# Patient Record
Sex: Male | Born: 1945 | Race: Black or African American | Hispanic: No | Marital: Married | Smoking: Current every day smoker
Health system: Southern US, Community
[De-identification: ages and names within clinical notes are randomized; demographics above are authoritative.]

## PROBLEM LIST (undated history)

## (undated) DIAGNOSIS — M549 Dorsalgia, unspecified: Secondary | ICD-10-CM

## (undated) DIAGNOSIS — E785 Hyperlipidemia, unspecified: Secondary | ICD-10-CM

## (undated) DIAGNOSIS — C9000 Multiple myeloma not having achieved remission: Secondary | ICD-10-CM

## (undated) DIAGNOSIS — I10 Essential (primary) hypertension: Secondary | ICD-10-CM

## (undated) DIAGNOSIS — R7302 Impaired glucose tolerance (oral): Secondary | ICD-10-CM

## (undated) DIAGNOSIS — M47812 Spondylosis without myelopathy or radiculopathy, cervical region: Secondary | ICD-10-CM

## (undated) DIAGNOSIS — C801 Malignant (primary) neoplasm, unspecified: Secondary | ICD-10-CM

## (undated) DIAGNOSIS — R361 Hematospermia: Secondary | ICD-10-CM

## (undated) DIAGNOSIS — F17200 Nicotine dependence, unspecified, uncomplicated: Secondary | ICD-10-CM

## (undated) DIAGNOSIS — F4310 Post-traumatic stress disorder, unspecified: Secondary | ICD-10-CM

## (undated) DIAGNOSIS — N400 Enlarged prostate without lower urinary tract symptoms: Secondary | ICD-10-CM

## (undated) DIAGNOSIS — N529 Male erectile dysfunction, unspecified: Secondary | ICD-10-CM

## (undated) DIAGNOSIS — E669 Obesity, unspecified: Secondary | ICD-10-CM

## (undated) HISTORY — PX: OTHER SURGICAL HISTORY: SHX169

## (undated) HISTORY — DX: Post-traumatic stress disorder, unspecified: F43.10

## (undated) HISTORY — PX: WISDOM TOOTH EXTRACTION: SHX21

## (undated) HISTORY — DX: Spondylosis without myelopathy or radiculopathy, cervical region: M47.812

## (undated) HISTORY — DX: Malignant (primary) neoplasm, unspecified: C80.1

## (undated) HISTORY — DX: Nicotine dependence, unspecified, uncomplicated: F17.200

## (undated) HISTORY — DX: Impaired glucose tolerance (oral): R73.02

## (undated) HISTORY — DX: Male erectile dysfunction, unspecified: N52.9

## (undated) HISTORY — DX: Obesity, unspecified: E66.9

## (undated) HISTORY — DX: Benign prostatic hyperplasia without lower urinary tract symptoms: N40.0

## (undated) HISTORY — DX: Hematospermia: R36.1

## (undated) HISTORY — DX: Essential (primary) hypertension: I10

## (undated) HISTORY — PX: CHOLECYSTECTOMY: SHX55

## (undated) HISTORY — DX: Multiple myeloma not having achieved remission: C90.00

## (undated) HISTORY — DX: Dorsalgia, unspecified: M54.9

## (undated) HISTORY — DX: Hyperlipidemia, unspecified: E78.5

## (undated) HISTORY — PX: APPENDECTOMY: SHX54

---

## 2000-06-06 ENCOUNTER — Encounter: Payer: Self-pay | Admitting: Family Medicine

## 2000-06-06 ENCOUNTER — Ambulatory Visit (HOSPITAL_COMMUNITY): Admission: RE | Admit: 2000-06-06 | Discharge: 2000-06-06 | Payer: Self-pay | Admitting: Family Medicine

## 2000-07-02 ENCOUNTER — Ambulatory Visit (HOSPITAL_COMMUNITY): Admission: RE | Admit: 2000-07-02 | Discharge: 2000-07-02 | Payer: Self-pay | Admitting: Family Medicine

## 2000-07-02 ENCOUNTER — Encounter: Payer: Self-pay | Admitting: Family Medicine

## 2000-07-07 ENCOUNTER — Ambulatory Visit (HOSPITAL_COMMUNITY): Admission: RE | Admit: 2000-07-07 | Discharge: 2000-07-07 | Payer: Self-pay | Admitting: Family Medicine

## 2000-07-07 ENCOUNTER — Encounter: Payer: Self-pay | Admitting: Family Medicine

## 2000-10-14 ENCOUNTER — Emergency Department (HOSPITAL_COMMUNITY): Admission: EM | Admit: 2000-10-14 | Discharge: 2000-10-14 | Payer: Self-pay | Admitting: Emergency Medicine

## 2000-10-14 ENCOUNTER — Encounter: Payer: Self-pay | Admitting: Emergency Medicine

## 2000-10-15 ENCOUNTER — Inpatient Hospital Stay (HOSPITAL_COMMUNITY): Admission: AD | Admit: 2000-10-15 | Discharge: 2000-10-21 | Payer: Self-pay | Admitting: Internal Medicine

## 2000-10-15 ENCOUNTER — Encounter: Payer: Self-pay | Admitting: Internal Medicine

## 2003-01-21 ENCOUNTER — Ambulatory Visit (HOSPITAL_COMMUNITY): Admission: RE | Admit: 2003-01-21 | Discharge: 2003-01-21 | Payer: Self-pay | Admitting: Family Medicine

## 2003-02-15 ENCOUNTER — Inpatient Hospital Stay (HOSPITAL_COMMUNITY): Admission: RE | Admit: 2003-02-15 | Discharge: 2003-02-19 | Payer: Self-pay | Admitting: Neurosurgery

## 2005-04-27 ENCOUNTER — Ambulatory Visit: Payer: Self-pay | Admitting: Family Medicine

## 2005-05-02 ENCOUNTER — Ambulatory Visit (HOSPITAL_COMMUNITY): Admission: RE | Admit: 2005-05-02 | Discharge: 2005-05-02 | Payer: Self-pay | Admitting: Family Medicine

## 2005-05-02 ENCOUNTER — Encounter: Payer: Self-pay | Admitting: Family Medicine

## 2005-06-04 ENCOUNTER — Ambulatory Visit (HOSPITAL_BASED_OUTPATIENT_CLINIC_OR_DEPARTMENT_OTHER): Admission: RE | Admit: 2005-06-04 | Discharge: 2005-06-04 | Payer: Self-pay | Admitting: Orthopedic Surgery

## 2006-01-14 ENCOUNTER — Ambulatory Visit: Payer: Self-pay | Admitting: Family Medicine

## 2006-01-15 ENCOUNTER — Encounter: Payer: Self-pay | Admitting: Family Medicine

## 2006-01-15 LAB — CONVERTED CEMR LAB
Cholesterol: 255 mg/dL — ABNORMAL HIGH (ref 0–200)
HDL: 45 mg/dL (ref >39)
Triglycerides: 259 mg/dL — ABNORMAL HIGH (ref <150)

## 2006-01-18 ENCOUNTER — Encounter: Payer: Self-pay | Admitting: Family Medicine

## 2006-01-18 LAB — CONVERTED CEMR LAB
ALT: 21 units/L (ref 0–53)
AST: 20 units/L (ref 0–37)
Albumin: 4.1 g/dL (ref 3.5–5.2)
Alkaline Phosphatase: 107 units/L (ref 39–117)
Total Bilirubin: 0.3 mg/dL (ref 0.3–1.2)

## 2006-07-02 ENCOUNTER — Ambulatory Visit: Payer: Self-pay | Admitting: Family Medicine

## 2006-07-02 ENCOUNTER — Ambulatory Visit (HOSPITAL_COMMUNITY): Admission: RE | Admit: 2006-07-02 | Discharge: 2006-07-02 | Payer: Self-pay | Admitting: Family Medicine

## 2006-07-08 ENCOUNTER — Ambulatory Visit (HOSPITAL_COMMUNITY): Admission: RE | Admit: 2006-07-08 | Discharge: 2006-07-08 | Payer: Self-pay | Admitting: Family Medicine

## 2006-08-05 ENCOUNTER — Ambulatory Visit: Payer: Self-pay | Admitting: Family Medicine

## 2006-08-06 ENCOUNTER — Encounter: Payer: Self-pay | Admitting: Family Medicine

## 2006-09-20 ENCOUNTER — Ambulatory Visit: Payer: Self-pay | Admitting: Family Medicine

## 2006-11-02 ENCOUNTER — Emergency Department (HOSPITAL_COMMUNITY): Admission: EM | Admit: 2006-11-02 | Discharge: 2006-11-02 | Payer: Self-pay | Admitting: Emergency Medicine

## 2006-11-04 ENCOUNTER — Emergency Department (HOSPITAL_COMMUNITY): Admission: EM | Admit: 2006-11-04 | Discharge: 2006-11-04 | Payer: Self-pay | Admitting: Emergency Medicine

## 2006-11-07 ENCOUNTER — Ambulatory Visit: Payer: Self-pay | Admitting: Family Medicine

## 2006-11-07 LAB — CONVERTED CEMR LAB
ALT: 17 units/L (ref 0–53)
AST: 17 units/L (ref 0–37)
Albumin: 4.3 g/dL (ref 3.5–5.2)
Alkaline Phosphatase: 90 units/L (ref 39–117)
Basophils Absolute: 0 10*3/uL (ref 0.0–0.1)
Basophils Relative: 0 % (ref 0–1)
Calcium: 9.7 mg/dL (ref 8.4–10.5)
Creatinine, Ser: 1.01 mg/dL (ref 0.40–1.50)
Eosinophils Relative: 2 % (ref 0–5)
HDL: 56 mg/dL (ref >39)
Hemoglobin: 12.1 g/dL — ABNORMAL LOW (ref 13.0–17.0)
MCHC: 32.6 g/dL (ref 30.0–36.0)
Monocytes Absolute: 0.5 10*3/uL (ref 0.2–0.7)
Neutro Abs: 2.9 10*3/uL (ref 1.7–7.7)
Platelets: 180 10*3/uL (ref 150–400)
RDW: 16 % — ABNORMAL HIGH (ref 11.5–14.0)
Total Protein: 7.5 g/dL (ref 6.0–8.3)
Triglycerides: 164 mg/dL — ABNORMAL HIGH (ref <150)

## 2006-11-11 ENCOUNTER — Emergency Department (HOSPITAL_COMMUNITY): Admission: EM | Admit: 2006-11-11 | Discharge: 2006-11-11 | Payer: Self-pay | Admitting: Emergency Medicine

## 2007-01-02 ENCOUNTER — Encounter: Payer: Self-pay | Admitting: Family Medicine

## 2007-05-12 ENCOUNTER — Ambulatory Visit: Payer: Self-pay | Admitting: Family Medicine

## 2007-05-15 ENCOUNTER — Encounter: Payer: Self-pay | Admitting: Family Medicine

## 2007-05-15 DIAGNOSIS — E669 Obesity, unspecified: Secondary | ICD-10-CM | POA: Insufficient documentation

## 2007-05-15 DIAGNOSIS — E785 Hyperlipidemia, unspecified: Secondary | ICD-10-CM

## 2007-05-15 DIAGNOSIS — I10 Essential (primary) hypertension: Secondary | ICD-10-CM

## 2007-08-13 ENCOUNTER — Ambulatory Visit: Payer: Self-pay | Admitting: Family Medicine

## 2007-08-13 DIAGNOSIS — D179 Benign lipomatous neoplasm, unspecified: Secondary | ICD-10-CM | POA: Insufficient documentation

## 2007-08-13 DIAGNOSIS — M479 Spondylosis, unspecified: Secondary | ICD-10-CM | POA: Insufficient documentation

## 2007-08-15 ENCOUNTER — Encounter: Payer: Self-pay | Admitting: Family Medicine

## 2007-08-18 ENCOUNTER — Ambulatory Visit (HOSPITAL_COMMUNITY): Admission: RE | Admit: 2007-08-18 | Discharge: 2007-08-18 | Payer: Self-pay | Admitting: Family Medicine

## 2007-09-10 ENCOUNTER — Encounter: Payer: Self-pay | Admitting: Family Medicine

## 2007-09-18 ENCOUNTER — Ambulatory Visit: Payer: Self-pay | Admitting: Family Medicine

## 2007-09-18 LAB — CONVERTED CEMR LAB
Protein, U semiquant: 30
Specific Gravity, Urine: 1.025
Urobilinogen, UA: 0.2
WBC Urine, dipstick: NEGATIVE

## 2007-09-23 DIAGNOSIS — N3000 Acute cystitis without hematuria: Secondary | ICD-10-CM | POA: Insufficient documentation

## 2007-09-23 DIAGNOSIS — N401 Enlarged prostate with lower urinary tract symptoms: Secondary | ICD-10-CM | POA: Insufficient documentation

## 2008-01-20 ENCOUNTER — Ambulatory Visit: Payer: Self-pay | Admitting: Family Medicine

## 2008-01-20 DIAGNOSIS — F4310 Post-traumatic stress disorder, unspecified: Secondary | ICD-10-CM

## 2008-01-20 DIAGNOSIS — F329 Major depressive disorder, single episode, unspecified: Secondary | ICD-10-CM

## 2008-01-21 LAB — CONVERTED CEMR LAB
AST: 20 units/L (ref 0–37)
Bilirubin, Direct: <0.1 mg/dL (ref 0.0–0.3)
CO2: 21 meq/L (ref 19–32)
Calcium: 9.6 mg/dL (ref 8.4–10.5)
Creatinine, Ser: 1 mg/dL (ref 0.40–1.50)
Glucose, Bld: 107 mg/dL — ABNORMAL HIGH (ref 70–99)
PSA: 0.82 ng/mL (ref 0.10–4.00)
Sodium: 140 meq/L (ref 135–145)
Total Bilirubin: 0.3 mg/dL (ref 0.3–1.2)
Total CHOL/HDL Ratio: 5.5

## 2008-01-26 ENCOUNTER — Telehealth: Payer: Self-pay | Admitting: Family Medicine

## 2008-01-28 ENCOUNTER — Telehealth: Payer: Self-pay | Admitting: Family Medicine

## 2008-02-04 ENCOUNTER — Encounter: Payer: Self-pay | Admitting: Family Medicine

## 2008-07-29 ENCOUNTER — Ambulatory Visit: Payer: Self-pay | Admitting: Family Medicine

## 2008-07-29 DIAGNOSIS — M549 Dorsalgia, unspecified: Secondary | ICD-10-CM

## 2008-08-02 ENCOUNTER — Encounter: Payer: Self-pay | Admitting: Family Medicine

## 2008-08-02 ENCOUNTER — Telehealth: Payer: Self-pay | Admitting: Family Medicine

## 2008-08-02 DIAGNOSIS — R361 Hematospermia: Secondary | ICD-10-CM

## 2008-08-04 ENCOUNTER — Encounter: Payer: Self-pay | Admitting: Family Medicine

## 2008-08-04 LAB — CONVERTED CEMR LAB
BUN: 13 mg/dL (ref 6–23)
Basophils Absolute: 0 10*3/uL (ref 0.0–0.1)
Bilirubin, Direct: 0.1 mg/dL (ref 0.0–0.3)
CO2: 24 meq/L (ref 19–32)
Chloride: 106 meq/L (ref 96–112)
Eosinophils Relative: 3 % (ref 0–5)
Glucose, Bld: 97 mg/dL (ref 70–99)
HCT: 36.6 % — ABNORMAL LOW (ref 39.0–52.0)
Hemoglobin: 11.4 g/dL — ABNORMAL LOW (ref 13.0–17.0)
Indirect Bilirubin: 0.3 mg/dL (ref 0.0–0.9)
LDL Cholesterol: 187 mg/dL — ABNORMAL HIGH (ref 0–99)
Lymphocytes Relative: 29 % (ref 12–46)
Lymphs Abs: 1.2 10*3/uL (ref 0.7–4.0)
Monocytes Absolute: 0.5 10*3/uL (ref 0.1–1.0)
Monocytes Relative: 11 % (ref 3–12)
Potassium: 4.3 meq/L (ref 3.5–5.3)
RDW: 15.6 % — ABNORMAL HIGH (ref 11.5–15.5)
TSH: 1.365 microintl units/mL (ref 0.350–4.500)
VLDL: 24 mg/dL (ref 0–40)

## 2008-12-03 ENCOUNTER — Encounter: Payer: Self-pay | Admitting: Family Medicine

## 2009-01-06 ENCOUNTER — Ambulatory Visit: Payer: Self-pay | Admitting: Family Medicine

## 2009-01-06 ENCOUNTER — Ambulatory Visit (HOSPITAL_COMMUNITY): Admission: RE | Admit: 2009-01-06 | Discharge: 2009-01-06 | Payer: Self-pay | Admitting: Family Medicine

## 2009-01-07 ENCOUNTER — Encounter: Payer: Self-pay | Admitting: Gastroenterology

## 2009-01-13 ENCOUNTER — Encounter: Payer: Self-pay | Admitting: Gastroenterology

## 2009-01-17 ENCOUNTER — Ambulatory Visit: Payer: Self-pay | Admitting: Gastroenterology

## 2009-01-17 ENCOUNTER — Ambulatory Visit (HOSPITAL_COMMUNITY): Admission: RE | Admit: 2009-01-17 | Discharge: 2009-01-17 | Payer: Self-pay | Admitting: Gastroenterology

## 2009-01-19 ENCOUNTER — Encounter (INDEPENDENT_AMBULATORY_CARE_PROVIDER_SITE_OTHER): Payer: Self-pay

## 2009-04-11 ENCOUNTER — Ambulatory Visit: Payer: Self-pay | Admitting: Family Medicine

## 2009-04-11 DIAGNOSIS — M129 Arthropathy, unspecified: Secondary | ICD-10-CM | POA: Insufficient documentation

## 2009-05-19 ENCOUNTER — Telehealth: Payer: Self-pay | Admitting: Family Medicine

## 2009-05-19 LAB — CONVERTED CEMR LAB
CO2: 24 meq/L (ref 19–32)
Calcium: 9.1 mg/dL (ref 8.4–10.5)
Chloride: 105 meq/L (ref 96–112)
Cholesterol: 227 mg/dL — ABNORMAL HIGH (ref 0–200)
Creatinine, Ser: 1.08 mg/dL (ref 0.40–1.50)
Glucose, Bld: 84 mg/dL (ref 70–99)
HCT: 40.7 % (ref 39.0–52.0)
Hgb A1c MFr Bld: 6 % — ABNORMAL HIGH (ref <5.7)
MCV: 83.1 fL (ref 78.0–100.0)
RBC: 4.9 M/uL (ref 4.22–5.81)
Sodium: 138 meq/L (ref 135–145)
TSH: 0.976 microintl units/mL (ref 0.350–4.500)
Total Bilirubin: 0.3 mg/dL (ref 0.3–1.2)
Total Protein: 7.4 g/dL (ref 6.0–8.3)
Triglycerides: 94 mg/dL (ref <150)
VLDL: 19 mg/dL (ref 0–40)
Vit D, 25-Hydroxy: 19 ng/mL — ABNORMAL LOW (ref 30–89)
WBC: 4.6 10*3/uL (ref 4.0–10.5)

## 2009-05-20 ENCOUNTER — Ambulatory Visit: Payer: Self-pay | Admitting: Family Medicine

## 2009-05-20 DIAGNOSIS — M109 Gout, unspecified: Secondary | ICD-10-CM

## 2009-05-20 DIAGNOSIS — E559 Vitamin D deficiency, unspecified: Secondary | ICD-10-CM

## 2009-06-02 ENCOUNTER — Ambulatory Visit: Payer: Self-pay | Admitting: Family Medicine

## 2009-06-02 LAB — CONVERTED CEMR LAB
Cholesterol, target level: 200 mg/dL
LDL Goal: 70 mg/dL

## 2009-06-07 LAB — CONVERTED CEMR LAB: Uric Acid, Serum: 10 mg/dL — ABNORMAL HIGH (ref 4.0–7.8)

## 2009-08-08 ENCOUNTER — Telehealth: Payer: Self-pay | Admitting: Family Medicine

## 2009-08-10 ENCOUNTER — Telehealth: Payer: Self-pay | Admitting: Physician Assistant

## 2009-08-11 ENCOUNTER — Telehealth: Payer: Self-pay | Admitting: Physician Assistant

## 2009-09-06 ENCOUNTER — Ambulatory Visit: Payer: Self-pay | Admitting: Family Medicine

## 2009-09-07 ENCOUNTER — Encounter: Payer: Self-pay | Admitting: Family Medicine

## 2009-09-07 LAB — CONVERTED CEMR LAB
BUN: 21 mg/dL (ref 6–23)
CO2: 26 meq/L (ref 19–32)
Calcium: 9.5 mg/dL (ref 8.4–10.5)
Chloride: 105 meq/L (ref 96–112)
Creatinine, Ser: 1.1 mg/dL (ref 0.40–1.50)
Glucose, Bld: 90 mg/dL (ref 70–99)
HDL: 33 mg/dL — ABNORMAL LOW (ref >39)
Hgb A1c MFr Bld: 6.2 % — ABNORMAL HIGH (ref <5.7)

## 2009-09-11 DIAGNOSIS — E739 Lactose intolerance, unspecified: Secondary | ICD-10-CM

## 2009-09-15 ENCOUNTER — Encounter: Payer: Self-pay | Admitting: Family Medicine

## 2009-09-15 LAB — CONVERTED CEMR LAB
Direct LDL: 102 mg/dL — ABNORMAL HIGH
Uric Acid, Serum: 6.9 mg/dL (ref 4.0–7.8)

## 2009-11-07 ENCOUNTER — Telehealth: Payer: Self-pay | Admitting: Family Medicine

## 2009-12-08 ENCOUNTER — Ambulatory Visit: Payer: Self-pay | Admitting: Family Medicine

## 2010-01-17 DIAGNOSIS — F528 Other sexual dysfunction not due to a substance or known physiological condition: Secondary | ICD-10-CM | POA: Insufficient documentation

## 2010-01-22 ENCOUNTER — Encounter: Payer: Self-pay | Admitting: Family Medicine

## 2010-01-31 NOTE — Letter (Signed)
Summary: Letter  Letter   Imported By: Lind Guest 09/16/2009 13:50:35  _____________________________________________________________________  External Attachment:    Type:   Image     Comment:   External Document

## 2010-01-31 NOTE — Progress Notes (Signed)
Summary: update lab order  Phone Note Call from Patient   Summary of Call: he went to do labs this morning and the lady over there  said to come by here and to get you to add on to his lab order  see if he has gout in his foot he is coming in tomorrow  and he is walking with  crutches Initial call taken by: Lind Guest,  May 19, 2009 11:37 AM  Follow-up for Phone Call        can we add uric acid to his labs? Follow-up by: Everitt Amber LPN,  May 19, 2009 1:22 PM  Additional Follow-up for Phone Call Additional follow up Details #1::        pls add uric acid level to labs Additional Follow-up by: Syliva Overman MD,  May 19, 2009 4:57 PM    Additional Follow-up for Phone Call Additional follow up Details #2::    already on lab order Follow-up by: Adella Hare LPN,  May 20, 2009 9:03 AM

## 2010-01-31 NOTE — Letter (Signed)
Summary: history and physical  history and physical   Imported By: Curtis Sites 06/21/2009 14:44:23  _____________________________________________________________________  External Attachment:    Type:   Image     Comment:   External Document

## 2010-01-31 NOTE — Progress Notes (Signed)
  Phone Note From Pharmacy   [facility]  [address] [phone]* Summary of Call: may we refill  hydrocodone? Initial call taken by: Adella Hare LPN,  August 08, 2009 2:09 PM  Follow-up for Phone Call        refill x 1 only, needs fasting lipid, chem 7 and HBA1C in 4 weeks needs to sched oV also, pls order and let him know Follow-up by: Syliva Overman MD,  August 08, 2009 3:33 PM  Additional Follow-up for Phone Call Additional follow up Details #1::        patient aware Additional Follow-up by: Adella Hare LPN,  August 08, 2009 3:39 PM    Prescriptions: VICODIN 5-500 MG TABS (HYDROCODONE-ACETAMINOPHEN) take 1 tablet every 4-6 hrs as needed for pain  #40 x 0   Entered by:   Adella Hare LPN   Authorized by:   Syliva Overman MD   Signed by:   Adella Hare LPN on 17/61/6073   Method used:   Printed then faxed to ...       [facility]  [address] [phone]* (retail)       [address]       [place], [place]  [address]       Ph: [phone] or [phone]       Fax: [phone]   RxID:   [id number]

## 2010-01-31 NOTE — Progress Notes (Signed)
  Phone Note Call from Patient   Summary of Call: Patient wants refill on phentermine. Last here 06/02/09 and his next OV is scheduled for 09/06/09 [facility] Initial call taken by: Everitt Amber LPN,  August 11, 2009 9:23 AM  Follow-up for Phone Call        this was done yesterday. Follow-up by: Esperanza Sheets PA,  August 11, 2009 9:36 AM

## 2010-01-31 NOTE — Letter (Signed)
Summary: demographic  demographic   Imported By: Curtis Sites 06/21/2009 14:43:44  _____________________________________________________________________  External Attachment:    Type:   Image     Comment:   External Document

## 2010-01-31 NOTE — Assessment & Plan Note (Signed)
Summary: office visit   Vital Signs:  Patient profile:   65 year old male Height:      72 inches Weight:      257.25 pounds BMI:     35.02 O2 Sat:      95 % Pulse rate:   81 / minute Pulse rhythm:   regular Resp:     16 per minute BP sitting:   140 / 80  (right arm)  Vitals Entered By: Everitt Amber LPN (September 06, 2009 8:41 AM)  Nutrition Counseling: Patient's BMI is greater than 25 and therefore counseled on weight management options. CC: Follow up chronic problems   Primary Care Provider:  Syliva Overman MD  CC:  Follow up chronic problems.  History of Present Illness: Reports  that he is doing much better, now that his apetite is reduced with the help[ of phentermine, and he is losing weight.He also reports less joint pains now that he has started allopurinol. Denies recent fever or chills. Denies sinus pressure, nasal congestion , ear pain or sore throat. Denies chest congestion, or cough productive of sputum. Denies chest pain, palpitations, PND, orthopnea or leg swelling. Denies abdominal pain, nausea, vomitting, diarrhea or constipation. Denies change in bowel movements or bloody stool. Denies dysuria , frequency, incontinence or hesitancy. Reports less  joint pain, swelling, or reduced mobility. Denies headaches, vertigo, seizures. Denies depression, anxiety or insomnia.Generally feels better now his wife ishome with him. Denies  rash, lesions, or itch.     Preventive Screening-Counseling & Management  Alcohol-Tobacco     Smoking Cessation Counseling: yes  Allergies: No Known Drug Allergies  Review of Systems      See HPI General:  Complains of fatigue. Eyes:  Denies discharge, eye pain, and red eye. Psych:  Complains of anxiety, depression, and mental problems; denies irritability, suicidal thoughts/plans, thoughts of violence, and unusual visions or sounds; unable to attend pTSD sessions as no funding avilable. Endo:  Denies excessive hunger. Heme:   Denies abnormal bruising and bleeding. Allergy:  Denies hives or rash and itching eyes.  Physical Exam  General:  Well-developed,obesein no acute distress; alert,appropriate and cooperative throughout examination HEENT: No facial asymmetry,  EOMI, No sinus tenderness, TM's Clear, oropharynx  pink and moist.   Chest: Clear to auscultation bilaterally.  CVS: S1, S2, No murmurs, No S3.   Abd: Soft, Nontender.  MS: Adequatdecreased  ROM spine,adequate in  hips, shoulders and knees.  Ext: No edema.   CNS: CN 2-12 intact, power tone and sensation normal throughout.   Skin: Intact, no visible lesions or rashes.  Psych: Good eye contact, normal affect.  Memory intact, not anxious or depressed appearing.    Impression & Recommendations:  Problem # 1:  OBESITY (ICD-278.00) Assessment Improved  Ht: 72 (09/06/2009)   Wt: 257.25 (09/06/2009)   BMI: 35.02 (09/06/2009), pt to continue phentermine as before  Problem # 2:  DEPRESSION, CHRONIC (ICD-311) Assessment: Improved  Discussed treatment options, including trial of antidpressant medication. Will refer to behavioral health. Follow-up call in in 24-48 hours and recheck in 2 weeks, sooner as needed. Patient agrees to call if any worsening of symptoms or thoughts of doing harm arise. Verified that the patient has no suicidal ideation at this time.   Problem # 3:  HYPERTENSION (ICD-401.9) Assessment: Deteriorated  BP today: 140/80, lifestyle modification only at this time Prior BP: 130/70 (06/02/2009)  Prior 10 Yr Risk Heart Disease: 22 % (06/02/2009)  Labs Reviewed: K+: 4.7 (05/19/2009) Creat: :  1.08 (05/19/2009)   Chol: 227 (05/19/2009)   HDL: 52 (05/19/2009)   LDL: 156 (05/19/2009)   TG: 94 (05/19/2009)  Problem # 4:  HYPERLIPIDEMIA (ICD-272.4) Assessment: Deteriorated  The following medications were removed from the medication list:    Simvastatin 40 Mg Tabs (Simvastatin) .Marland Kitchen... Take 1 tablet by mouth once a day His updated  medication list for this problem includes:    Lovastatin 40 Mg Tabs (Lovastatin) .Marland Kitchen... Take 1 tab by mouth at bedtime  Labs Reviewed: SGOT: 21 (05/19/2009)   SGPT: 19 (05/19/2009)  Lipid Goals: Chol Goal: 200 (06/02/2009)   HDL Goal: 40 (06/02/2009)   LDL Goal: 70 (06/02/2009)   TG Goal: 150 (06/02/2009)  Prior 10 Yr Risk Heart Disease: 22 % (06/02/2009)   HDL:52 (05/19/2009), 46 (08/02/2008)  LDL:156 (05/19/2009), 187 (08/02/2008)  Chol:227 (05/19/2009), 257 (08/02/2008)  Trig:94 (05/19/2009), 122 (08/02/2008)  Problem # 5:  IMPAIRED GLUCOSE TOLERANCE (ICD-271.3) Assessment: Comment Only hBA1C  6.2 09/2009 Pt advised to reduce carbohydrate intake, espescially sweets, and to start regular physical activity, at least 30 minutes 5 days weekly, to enable weight loss, and reduce the risk of becoming diabetic   Problem # 6:  GOUT (ICD-274.9) Assessment: Improved  His updated medication list for this problem includes:    Allopurinol 100 Mg Tabs (Allopurinol) ..... One tab by mouth once daily  Orders: T-Uric Acid (Blood) ([id number])  Complete Medication List: 1)  Ibuprofen 800 Mg Tabs (Ibuprofen) .... One tab by mouth bid 2)  Vitamin D (ergocalciferol) 50000 Unit Caps (Ergocalciferol) .... Take 1 weekly 3)  Vicodin 5-500 Mg Tabs (Hydrocodone-acetaminophen) .... Take 1 tablet every 4-6 hrs as needed for pain 4)  Aspirin 81 Mg Tbec (Aspirin) .... Take 1 daily 5)  Allopurinol 100 Mg Tabs (Allopurinol) .... One tab by mouth once daily 6)  Phentermine Hcl 37.5 Mg Tabs (Phentermine hcl) .... One tab by mouth once daily 7)  Lovastatin 40 Mg Tabs (Lovastatin) .... Take 1 tab by mouth at bedtime  Patient Instructions: 1)  Please schedule a follow-up appointment in 3 months. 2)  It is important that you exercise regularly at least 20 minutes 5 times a week. If you develop chest pain, have severe difficulty breathing, or feel very tired , stop exercising immediately and seek medical  attention. 3)  You need to lose weight. Consider a lower calorie diet and regular exercise.  4)  Uric acid level in 3 months 5)  Congrats on weight loss. 6)  Tobacco is very bad for your health and your loved ones! You Should stop smoking!. 7)  Stop Smoking Tips: Choose a Quit date. Cut down before the Quit date. decide what you will do as a substitute when you feel the urge to smoke(gum,toothpick,exercise).  Prescriptions: ALLOPURINOL 100 MG TABS (ALLOPURINOL) one tab by mouth once daily  #30 Tablet x 3   Entered by:   Everitt Amber LPN   Authorized by:   Syliva Overman MD   Signed by:   Everitt Amber LPN on 29/52/8413   Method used:   Printed then [facility]ed [facility] ...       [facility]  [address] [phone]* (retail)       [address]       [place], [place]  [address]       Ph: [phone] or [phone]       Fax: [phone]   RxID:   [id number] VICODIN 5-500 MG TABS (HYDROCODONE-ACETAMINOPHEN) take 1 tablet  every 4-6 hrs as needed for pain  #40 x 1   Entered by:   Everitt Amber LPN   Authorized by:   Syliva Overman MD   Signed by:   Everitt Amber LPN on 16/10/9602   Method used:   Printed then faxed to ...       [facility]  [address] [phone]* (retail)       [address]       [place], [place]  [address]       Ph: [phone] or [phone]       Fax: [phone]   RxID:   [id number] IBUPROFEN 800 MG TABS (IBUPROFEN) one tab by mouth bid  #100 x 3   Entered by:   Everitt Amber LPN   Authorized by:   Syliva Overman MD   Signed by:   Everitt Amber LPN on 74/25/9563   Method used:   Printed then faxed to ...       [facility]  [address] [phone]* (retail)       [address]       [place], [place]  [address]       Ph: [phone] or [phone]       Fax: [phone]   RxID:   [id number] LOVASTATIN 40 MG TABS (LOVASTATIN) Take 1 tab by mouth at bedtime  #30 x 3   Entered and Authorized by:   Syliva Overman MD   Signed by:   Syliva Overman MD on  09/06/2009   Method used:   Printed then faxed to ...       [facility]  [address] [phone]* (retail)       [address]       [place], [place]  [address]       Ph: [phone] or [phone]       Fax: [phone]   RxID:   [id number]     Preventive Care Screening  Last Tetanus Booster:    Date:  01/02/2004    Results:  Td

## 2010-01-31 NOTE — Letter (Signed)
Summary: TRIAGE  TRIAGE   Imported By: Diana Eves 01/07/2009 13:45:25  _____________________________________________________________________  External Attachment:    Type:   Image     Comment:   External Document  Appended Document: TRIAGE TRILYTE PREP.  Appended Document: TRIAGE [facility] pt. [facility]

## 2010-01-31 NOTE — Letter (Signed)
Summary: misc.  misc.   Imported By: Curtis Sites 06/22/2009 11:41:52  _____________________________________________________________________  External Attachment:    Type:   Image     Comment:   External Document

## 2010-01-31 NOTE — Letter (Signed)
Summary: Patient Notice, Colon Biopsy Results  [facility]  [address]   [place], [place] [address]   Phone: [phone]  Fax: [phone]       January 19, 2009   [name] [address] [place], [place]  [address] Mar 19, 1945    Dear Mr. [name],  I am pleased to inform you that the biopsies taken during your recent colonoscopy did not show any evidence of cancer upon pathologic examination.  Additional information/recommendations:  __Please follow a high fiber diet  __You should have a repeat colonoscopy examination  in 10 years.  Please call us if you are having persistent problems or have questions about your condition that have not been fully answered at this time.  Sincerely,    Hendricks Limes LPN  [facility] Ph: [phone]    Fax: [phone]

## 2010-01-31 NOTE — Progress Notes (Signed)
  Phone Note From Pharmacy   [facility]  [address]. [phone]* Summary of Call: requesting refill on phentermine Initial call taken by: Adella Hare LPN,  November 07, 2009 10:33 AM  Follow-up for Phone Call        refill x1 needs ov before anymore Follow-up by: Syliva Overman MD,  November 07, 2009 1:05 PM    Prescriptions: PHENTERMINE HCL 37.5 MG TABS (PHENTERMINE HCL) one tab by mouth once daily  #30 x 0   Entered by:   Adella Hare LPN   Authorized by:   Syliva Overman MD   Signed by:   Adella Hare LPN on 96/04/5407   Method used:   Printed then faxed to ...       [facility]  [address] [phone]* (retail)       [address]       [place], [place]  [address]       Ph: [phone] or [phone]       Fax: [phone]   RxID:   [id number]

## 2010-01-31 NOTE — Medication Information (Signed)
Summary: Tax adviser   Imported By: Lind Guest 01/06/2009 13:15:58  _____________________________________________________________________  External Attachment:    Type:   Image     Comment:   External Document

## 2010-01-31 NOTE — Letter (Signed)
Summary: progress notes  progress notes   Imported By: Curtis Sites 06/22/2009 11:42:27  _____________________________________________________________________  External Attachment:    Type:   Image     Comment:   External Document

## 2010-01-31 NOTE — Letter (Signed)
Summary: phone notes  phone notes   Imported By: Curtis Sites 06/22/2009 11:42:09  _____________________________________________________________________  External Attachment:    Type:   Image     Comment:   External Document

## 2010-01-31 NOTE — Letter (Signed)
Summary: consult  consult   Imported By: Curtis Sites 06/21/2009 14:43:26  _____________________________________________________________________  External Attachment:    Type:   Image     Comment:   External Document

## 2010-01-31 NOTE — Letter (Signed)
Summary: xray  xray   Imported By: Curtis Sites 06/22/2009 11:43:06  _____________________________________________________________________  External Attachment:    Type:   Image     Comment:   External Document

## 2010-01-31 NOTE — Letter (Signed)
Summary: labs  labs   Imported By: Curtis Sites 06/22/2009 11:41:36  _____________________________________________________________________  External Attachment:    Type:   Image     Comment:   External Document

## 2010-01-31 NOTE — Assessment & Plan Note (Signed)
Summary: right foot pain- room 2   Vital Signs:  Patient profile:   65 year old male Height:      72 inches Weight:      267.75 pounds BMI:     36.44 O2 Sat:      100 % on Room air Pulse rate:   80 / minute Resp:     16 per minute BP sitting:   160 / 70  (left arm)  Vitals Entered By: Adella Hare LPN (May 20, 2009 9:03 AM)  Serial Vital Signs/Assessments:  Time      Position  BP       Pulse  Resp  Temp     By                     144/92                         Esperanza Sheets PA  CC: right foot pain Is Patient Diabetic? No Pain Assessment Patient in pain? yes     Location: right foot Intensity: 5 Type: aching Onset of pain  Constant Comments did not bring meds to ov   Primary Provider:  Syliva Overman MD  CC:  right foot pain.  History of Present Illness: Pt developed Rt ankle/foot pain and swelling last Fri evening/Sat morning.  No trauma.  Hx of gout.  Pain and swelling have been worsening.  He is taking Ibuprofen , but needs new prescription. Had 1 beer but was the day after onset.  No diet changes.  Pain is worse with wt bearing.  Labs were done yesterday.  Has not started his cholesterol medication yet. He has been monitoring his BP at home & states it has been good.  He thinks it is up today because of his pain. He has started smoking again.  Plans to quit again as soon as he loses more weight.  He had been taking Phentermine, but has discontinued.  He has a few more at home still.    Allergies (verified): No Known Drug Allergies  Past History:  Past medical history reviewed for relevance to current acute and chronic problems.  Past Medical History: Current Problems:  NICOTINE ADDICTION (ICD-305.1) OBESITY (ICD-278.00) HYPERLIPIDEMIA (ICD-272.4) HYPERTENSION (ICD-401.9) BPH Gout  Physical Exam  General:  Well-developed,well-nourished,in no acute distress; alert,appropriate and cooperative throughout examination Head:  Normocephalic and  atraumatic without obvious abnormalities. No apparent alopecia or balding. Lungs:  Normal respiratory effort, chest expands symmetrically. Lungs are clear to auscultation, no crackles or wheezes. Heart:  Normal rate and regular rhythm. S1 and S2 normal without gallop, murmur, click, rub or other extra sounds. Msk:  Rt ankle: decreased ROM due to pain and swelling. Nontender to palp foot.  Mildly tender anterior ankle joint line. Nontender at Lateral malleolus, and inferior to lateral malleolus.  No ligament instability.   Pulses:  R posterior tibial normal and R dorsalis pedis normal.   Neurologic:  alert & oriented X3 and sensation intact to light touch.  Pt is ambulating with crutches. Skin:  Mild erythema noted medial Rt ankle.  No warmth to touch. Psych:  Cognition and judgment appear intact. Alert and cooperative with normal attention span and concentration. No apparent delusions, illusions, hallucinations   Impression & Recommendations:  Problem # 1:  GOUT (ICD-274.9) Assessment Deteriorated  Gout hand out given.  His updated medication list for this problem includes:    Ibuprofen 800 Mg Tabs (Ibuprofen) .Marland KitchenMarland KitchenMarland KitchenMarland Kitchen  One tab by mouth bid    Colcrys 0.6 Mg Tabs (Colchicine) .Marland Kitchen... Take 2 tabs now, then 1 tab 1 hr later.  then take 1 tab two times a day thereafter  Orders: Depo- Medrol 80mg  (J1040) Admin of Therapeutic Inj  intramuscular or subcutaneous ([address])  Problem # 2:  HYPERLIPIDEMIA (ICD-272.4) Assessment: Deteriorated Encouraged pt to restart Simvastatin as discussed. Chol diet handout given. His updated medication list for this problem includes:    Simvastatin 40 Mg Tabs (Simvastatin) .Marland Kitchen... Take 1 tablet by mouth once a day  Problem # 3:  HYPERTENSION (ICD-401.9) Assessment: Comment Only Little high today.  Will monitor. Not on any BP meds currently.  Problem # 4:  VITAMIN D DEFICIENCY (ICD-268.9) Assessment: New  Complete Medication List: 1)  Ibuprofen 800 Mg Tabs  (Ibuprofen) .... One tab by mouth bid 2)  Flomax 0.4 Mg Xr24h-cap (Tamsulosin hcl) .... One tab by mouth qd 3)  Simvastatin 40 Mg Tabs (Simvastatin) .... Take 1 tablet by mouth once a day 4)  Phentermine Hcl 37.5 Mg Caps (Phentermine hcl) .... Take one daily in am 5)  Colcrys 0.6 Mg Tabs (Colchicine) .... Take 2 tabs now, then 1 tab 1 hr later.  then take 1 tab two times a day thereafter 6)  Vitamin D (ergocalciferol) 50000 Unit Caps (Ergocalciferol) .... Take 1 weekly 7)  Vicodin 5-500 Mg Tabs (Hydrocodone-acetaminophen) .... Take 1 tablet every 4-6 hrs as needed for pain  Patient Instructions: 1)  Recheck appt next Thursday.  Sooner if worsens. 2)  I have prescribed Ibuprofen, Cholchine and Vicoden for your gout. 3)  I have prescribed Vit D as discussed. 4)  Restart your cholesterol medication.  And follow a low fat diet. 5)  Tobacco is very bad for your health and your loved ones! You Should stop smoking!. 6)  Stop Smoking Tips: Choose a Quit date. Cut down before the Quit date. decide what you will do as a substitute when you feel the urge to smoke(gum,toothpick,exercise). 7)  It is important that you exercise regularly at least 20 minutes 5 times a week. If you develop chest pain, have severe difficulty breathing, or feel very tired , stop exercising immediately and seek medical attention. 8)  You need to lose weight. Consider a lower calorie diet and regular exercise.  Prescriptions: VICODIN 5-500 MG TABS (HYDROCODONE-ACETAMINOPHEN) take 1 tablet every 4-6 hrs as needed for pain  #40 x 0   Entered and Authorized by:   Esperanza Sheets PA   Signed by:   Esperanza Sheets PA on 05/20/2009   Method used:   Printed then faxed to ...       [facility]  [address] [phone]* (retail)       [address]       [place], [place]  [address]       Ph: [phone] or [phone]       Fax: [phone]   RxID:   [id number] VITAMIN D (ERGOCALCIFEROL) 50000 UNIT CAPS (ERGOCALCIFEROL) take 1 weekly   #4 x 3   Entered and Authorized by:   Esperanza Sheets PA   Signed by:   Esperanza Sheets PA on 05/20/2009   Method used:   Electronically to        [facility]. [phone]* (retail)       [address]       [place], [place]  [address]  Ph: [phone] or [phone]       Fax: [phone]   RxID:   [id number] COLCRYS 0.6 MG TABS (COLCHICINE) take 2 tabs now, then 1 tab 1 hr later.  Then take 1 tab two times a day thereafter  #60 x 1   Entered and Authorized by:   Esperanza Sheets PA   Signed by:   Esperanza Sheets PA on 05/20/2009   Method used:   Electronically to        [facility]. [phone]* (retail)       [address]       [place], [place]  [address]       Ph: [phone] or [phone]       Fax: [phone]   RxID:   [id number] IBUPROFEN 800 MG TABS (IBUPROFEN) one tab by mouth bid  #100 x 1   Entered and Authorized by:   Esperanza Sheets PA   Signed by:   Esperanza Sheets PA on 05/20/2009   Method used:   Electronically to        [facility]. [phone]* (retail)       [address]       [place], [place]  [address]       Ph: [phone] or [phone]       Fax: [phone]   RxID:   [id number]    Medication Administration  Injection # 1:    Medication: Depo- Medrol 80mg     Diagnosis: GOUT (ICD-274.9)    Route: IM    Site: LUOQ gluteus    Exp Date: 1/12    Lot #: Johny [id number]    Mfr: Pharmacia    Patient tolerated injection without complications    Given by: Adella Hare LPN (May 20, 2009 9:56 AM)  Orders Added: 1)  Depo- Medrol 80mg  [J1040] 2)  Admin of Therapeutic Inj  intramuscular or subcutaneous [[address]] 3)  Est. Patient Level IV [[address]]

## 2010-01-31 NOTE — Progress Notes (Signed)
  Phone Note From Pharmacy   [facility]  [address]. [phone]* Summary of Call: okay to refill phentermine? Initial call taken by: Adella Hare LPN,  August 10, 2009 11:25 AM  Follow-up for Phone Call        May give 1 mos only, #30 no refill. Follow-up by: Esperanza Sheets PA,  August 10, 2009 1:01 PM    New/Updated Medications: PHENTERMINE HCL 37.5 MG TABS (PHENTERMINE HCL) one tab by mouth once daily Prescriptions: PHENTERMINE HCL 37.5 MG TABS (PHENTERMINE HCL) one tab by mouth once daily  #30 x 0   Entered by:   Adella Hare LPN   Authorized by:   Esperanza Sheets PA   Signed by:   Adella Hare LPN on 96/04/5407   Method used:   Printed then faxed to ...       [facility]  [address] [phone]* (retail)       [address]       [place], [place]  [address]       Ph: [phone] or [phone]       Fax: [phone]   RxID:   [id number]

## 2010-01-31 NOTE — Assessment & Plan Note (Signed)
Summary: physical- room 1   Vital Signs:  Patient profile:   65 year old male Height:      72 inches Weight:      262.25 pounds BMI:     35.70 O2 Sat:      98 % on Room air Pulse rate:   82 / minute Resp:     16 per minute BP sitting:   130 / 70  (left arm)  Vitals Entered By: Adella Hare LPN (June 02, 452 9:24 AM)  Nutrition Counseling: Patient's BMI is greater than 25 and therefore counseled on weight management options. CC: physical, Hypertension Management, Lipid Management Is Patient Diabetic? No Pain Assessment Patient in pain? no      Comments did not bring meds to ov   Primary Provider:  Syliva Overman MD  CC:  physical, Hypertension Management, and Lipid Management.  History of Present Illness: Pt is here today for a physical.  He was recently seen for a gout flare up.  He did not fill the colchicine prescription due to  cost.  He states he started a medication that the [place] gave him, but he did not bring it with him today.  His ankle and foot pain is much better and swelling has gone down.  He still has discomfort in the ankle though with prolonged standing.  Hx of htn.  Has been off meds x 3-4 meds.  Is active currently gardening.  Is not exercising. Hx of hyperlipidemia.  Had not been taking Simvastatin before last labs.  Since has restarted but admits he is not taking them regularly. He is not eating fried foods, but is not following a low fat diet. Pt has been trying to lose wt.  He is down 5# since his last visit.  Is only eating 1 meal a day.  Is eating ice cream at night though and drinking sodas.  + smoker currenty < 1 ppd. Last eye exam > 2 yrs ago. Colonoscopy UTD. Last Tetnus vaccine < 10 yrs ago ( approx 2 yrs ago)       Hypertension History:      He denies headache, chest pain, palpitations, dyspnea with exertion, peripheral edema, and side effects from treatment.  He notes no problems with any antihypertensive medication side effects.      Positive major cardiovascular risk factors include male age 31 years old or older, hyperlipidemia, hypertension, and current tobacco user.  Negative major cardiovascular risk factors include no history of diabetes.        Positive history for target organ damage include prior stroke (or TIA).  Further assessment for target organ damage reveals no history of ASHD, cardiac end-organ damage (CHF/LVH), peripheral vascular disease, renal insufficiency, or hypertensive retinopathy.    Lipid Management History:      Positive NCEP/ATP III risk factors include male age 25 years old or older, current tobacco user, hypertension, and prior stroke (or TIA).  Negative NCEP/ATP III risk factors include non-diabetic, no ASHD (atherosclerotic heart disease), and no peripheral vascular disease.        The patient states that he knows about the "Therapeutic Lifestyle Change" diet.  His compliance with the TLC diet is fair.  The patient expresses understanding of adjunctive measures for cholesterol lowering.  He expresses no side effects from his lipid-lowering medication.  The patient denies any symptoms to suggest myopathy or liver disease.      Allergies (verified): No Known Drug Allergies  Past History:  Past medical, surgical,  family and social histories (including risk factors) reviewed, and no changes noted (except as noted below).  Past Medical History: Reviewed history from 05/20/2009 and no changes required. Current Problems:  NICOTINE ADDICTION (ICD-305.1) OBESITY (ICD-278.00) HYPERLIPIDEMIA (ICD-272.4) HYPERTENSION (ICD-401.9) BPH Gout  Past Surgical History: Appendectomy Cholecystectomy Right bunionectomy Extraction of wisdom teeth Lumbar fusion 2004  Family History: Reviewed history from 01/06/2009 and no changes required. Mom HTN,Heart dz Dad deceased colon cancer age 78 Sister 2 ? healthy Brother 1 DM,Hep c,  Social History: Reviewed history from 09/18/2007 and no changes  required. Disabled Married Two children Former Smoker Alcohol use-no Drug use-no, past history  Review of Systems General:  Denies chills, fever, and loss of appetite. Eyes:  Denies blurring and double vision. ENT:  Complains of earache; denies nasal congestion and sore throat; INTERMITTENT ACHING LT EAR. CV:  Denies chest pain or discomfort, palpitations, and swelling of feet. Resp:  Denies cough and shortness of breath. GI:  Denies abdominal pain, bloody stools, change in bowel habits, constipation, dark tarry stools, diarrhea, indigestion, loss of appetite, nausea, and vomiting. GU:  Complains of decreased libido, erectile dysfunction, and nocturia; denies urinary frequency; INTERMITTENT NOCTURIA, NOTICES IS RELATED TO SODA INTAKE. Derm:  Denies lesion(s) and rash. Neuro:  Complains of numbness and tingling; denies headaches and weakness; SOMETIMES HAS IN RUE WHEN SLEEPING, HAS BEEN SINCE ROTATOR CUFF SURGERY. Psych:  Denies anxiety and depression. Allergy:  Denies seasonal allergies.  Physical Exam  General:  Well-developed,well-nourished,in no acute distress; alert,appropriate and cooperative throughout examination Head:  Normocephalic and atraumatic without obvious abnormalities. No apparent alopecia or balding. Eyes:  No corneal or conjunctival inflammation noted. EOMI. Perrla. Funduscopic exam benign, without hemorrhages, exudates or papilledema.  Ears:  External ear exam shows no significant lesions or deformities.  Otoscopic examination reveals clear canals, tympanic membranes are intact bilaterally without bulging, retraction, inflammation or discharge. Hearing is grossly normal bilaterally. Nose:  External nasal examination shows no deformity or inflammation. Nasal mucosa are pink and moist without lesions or exudates. Mouth:  Oral mucosa and oropharynx without lesions or exudates.  Teeth in good repair. Neck:  No deformities, masses, or tenderness noted. Lungs:  Normal  respiratory effort, chest expands symmetrically. Lungs are clear to auscultation, no crackles or wheezes. Heart:  Normal rate and regular rhythm. S1 and S2 normal without gallop, murmur, click, rub or other extra sounds. Abdomen:  Bowel sounds positive,abdomen soft and non-tender without masses, organomegaly or hernias noted. Rectal:  No external abnormalities noted. Normal sphincter tone. No rectal masses or tenderness. Genitalia:  Testes bilaterally descended without nodularity, tenderness or masses. No scrotal masses or lesions. No penis lesions or urethral discharge. circumcised.   Prostate:  no nodules, no asymmetry, and 1+ enlarged.   Msk:  Mild edema noted Rt ankle. normal ROM, no joint tenderness, no joint warmth, and no redness over joints.   Pulses:  R femoral normal, R posterior tibial normal, R dorsalis pedis normal, L femoral normal, L posterior tibial normal, and L dorsalis pedis normal.   Extremities:  No clubbing, cyanosis,  or deformity noted with normal full range of motion of all joints.   Neurologic:  alert & oriented X3, strength normal in all extremities, sensation intact to light touch, gait normal, and DTRs symmetrical and normal.   Skin:  Lg lipoma noted Lt superior posterior neck, and approx 1 cm Rt upper arm.  Cervical Nodes:  No lymphadenopathy noted Psych:  Cognition and judgment appear intact. Alert and cooperative with  normal attention span and concentration. No apparent delusions, illusions, hallucinations   Impression & Recommendations:  Problem # 1:  HYPERTENSION (ICD-401.9) Assessment Improved  Orders: T-Comprehensive Metabolic Panel ([id number])  BP today: 130/70 Prior BP: 160/70 (05/20/2009)  10 Yr Risk Heart Disease: 22 %  Labs Reviewed: K+: 4.7 (05/19/2009) Creat: : 1.08 (05/19/2009)   Chol: 227 (05/19/2009)   HDL: 52 (05/19/2009)   LDL: 156 (05/19/2009)   TG: 94 (05/19/2009)  Problem # 2:  HYPERLIPIDEMIA (ICD-272.4) Assessment: Comment  Only Encouraged medication compliance.  Discussed dietary changes.  His updated medication list for this problem includes:    Simvastatin 40 Mg Tabs (Simvastatin) .Marland Kitchen... Take 1 tablet by mouth once a day  Orders: T-Lipid Profile [id number]) T-Comprehensive Metabolic Panel [id number])  Labs Reviewed: SGOT: 21 (05/19/2009)   SGPT: 19 (05/19/2009)  Lipid Goals: Chol Goal: 200 (06/02/2009)   HDL Goal: 40 (06/02/2009)   LDL Goal: 70 (06/02/2009)   TG Goal: 150 (06/02/2009)  10 Yr Risk Heart Disease: 22 %   HDL:52 (05/19/2009), 46 (08/02/2008)  LDL:156 (05/19/2009), 187 (08/02/2008)  Chol:227 (05/19/2009), 257 (08/02/2008)  Trig:94 (05/19/2009), 122 (08/02/2008)  Problem # 3:  GOUT (ICD-274.9) Assessment: Improved  The following medications were removed from the medication list:    Colcrys 0.6 Mg Tabs (Colchicine) .Marland Kitchen... Take 2 tabs now, then 1 tab 1 hr later.  then take 1 tab two times a day thereafter His updated medication list for this problem includes:    Ibuprofen 800 Mg Tabs (Ibuprofen) ..... One tab by mouth bid  Orders: T-Uric Acid (Blood) [id number]) - now T-Uric Acid (Blood) [id number]) - 3 mos Miscellaneous Other Radiology (Misc Other Rad)  Problem # 4:  OBESITY (ICD-278.00) Assessment: Improved  Ht: 72 (06/02/2009)   Wt: 262.25 (06/02/2009)   BMI: 35.70 (06/02/2009)  Problem # 5:  VITAMIN D DEFICIENCY (ICD-268.9) Assessment: New  Orders: T-Vitamin D (25-Hydroxy) ([id number])  Problem # 6:  NICOTINE ADDICTION (ICD-305.1) Assessment: Comment Only  Encouraged smoking cessation.  Complete Medication List: 1)  Ibuprofen 800 Mg Tabs (Ibuprofen) .... One tab by mouth bid 2)  Simvastatin 40 Mg Tabs (Simvastatin) .... Take 1 tablet by mouth once a day 3)  Vitamin D (ergocalciferol) 50000 Unit Caps (Ergocalciferol) .... Take 1 weekly 4)  Vicodin 5-500 Mg Tabs (Hydrocodone-acetaminophen) .... Take 1 tablet every 4-6 hrs as needed for pain 5)  Aspirin 81 Mg Tbec  (Aspirin) .... Take 1 daily  Other Orders: Hemoccult Guaiac-1 spec.(in office) ([id number])  Hypertension Assessment/Plan:      The patient's hypertensive risk group is category C: Target organ damage and/or diabetes.  His calculated 10 year risk of coronary heart disease is 22 %.  Today's blood pressure is 130/70.    Lipid Assessment/Plan:      Based on NCEP/ATP III, the patient's risk factor category is "history of coronary disease, peripheral vascular disease, cerebrovascular disease, or aortic aneurysm along with either diabetes, current smoker, or LDL > 130 plus HDL < 40 plus triglycerides > 200".  The patient's lipid goals are as follows: Total cholesterol goal is 200; LDL cholesterol goal is 70; HDL cholesterol goal is 40; Triglyceride goal is 150.    Patient Instructions: 1)  Please schedule a follow-up appointment in 3 months. 2)  Tobacco is very bad for your health and your loved ones! You Should stop smoking!. 3)  Stop Smoking Tips: Choose a Quit date. Cut down before the Quit date. decide what you will do as a substitute when you feel the  urge to smoke(gum,toothpick,exercise). 4)  It is important that you exercise regularly at least 20 minutes 5 times a week. If you develop chest pain, have severe difficulty breathing, or feel very tired , stop exercising immediately and seek medical attention. 5)  You need to lose weight. Consider a lower calorie diet and regular exercise.  6)  Call back with the name of the gout medicine you are taking. 7)  I have ordered blood work to recheck you uric acid level today.  I have also ordered blood work to have done in 3 mos fasting. 8)  I have ordered an xray of your Rt ankle. 9)  YOU MUST TAKE YOUR CHOLESTEROL MEDICATION EVERY DAY. If you do not improve your cholesterol levels, you are putting your self at risk for a heart attack or stroke.  Your smoking also increases this risk!    Laboratory Results    Stool - Occult Blood Hemmoccult #1:  negative Date: 06/02/2009

## 2010-01-31 NOTE — Assessment & Plan Note (Signed)
Summary: office visit   Vital Signs:  Patient profile:   65 year old male Height:      72 inches Weight:      262.75 pounds BMI:     35.76 O2 Sat:      96 % Pulse rate:   86 / minute Pulse rhythm:   regular Resp:     16 per minute BP sitting:   140 / 78 Cuff size:   large  Vitals Entered By: Everitt Amber (January 06, 2009 8:30 AM)  Nutrition Counseling: Patient's BMI is greater than 25 and therefore counseled on weight management options. CC: Follow up chronic problems, states he still has back pain and said it has gotten better since his pain meds ran out   Primary Care Provider:  Syliva Overman MD  CC:  Follow up chronic problems and states he still has back pain and said it has gotten better since his pain meds ran out.  History of Present Illness: THE PT COMES IN STATING THAT HE HAS DISCONTINUED HIS PAIN MEDS SINCE THEY RAN OUT OVER 1 WEEK AGO AND THAT HE ACTUALLY FEELS BETTER. hE HAS ALSOSTOPPED HIS Bp MEDS. hE IS WANTING DESPERATELY TO DISCONTINUE SMOKING AND PLANS TO MAKE IT HAPPen this year.He is a recovered alcohol and drug addict. He reports stress, his wifre lost her job and Engineer, agricultural and he is still struggling with the [place] for coverage fr himself from the vA. He requests that a letter be written on his behalf so that she may get any financial benefits due to her on bhis death.  Current Medications (verified): 1)  Ibuprofen 800 Mg Tabs (Ibuprofen) .... One Tab By Mouth Bid 2)  Flomax 0.4 Mg Xr24h-Cap (Tamsulosin Hcl) .... One Tab By Mouth Qd 3)  Simvastatin 40 Mg Tabs (Simvastatin) .... Take 1 Tablet By Mouth Once A Day 4)  Hydrochlorothiazide 25 Mg Tabs (Hydrochlorothiazide) .... Take 1 Tablet By Mouth Once A Day  Allergies (verified): No Known Drug Allergies  Family History: Mom HTN,Heart dz Dad deceased colon cancer age 28 Sister 2 ? healthy Brother 1 DM,Hep c,  Review of Systems      See HPI General:  Denies chills and fever. Eyes:  Denies  blurring and discharge. ENT:  Denies earache, hoarseness, sinus pressure, and sore throat. CV:  Denies chest pain or discomfort, lightheadness, palpitations, and swelling of feet. Resp:  Denies cough and sputum productive. GI:  Denies abdominal pain, constipation, diarrhea, nausea, and vomiting. GU:  Denies dysuria, urinary frequency, and urinary hesitancy. MS:  Complains of low back pain and mid back pain; improved. Derm:  Denies itching and rash. Neuro:  Denies headaches, seizures, and tingling. Psych:  Complains of anxiety, depression, and irritability; denies easily angered, easily tearful, panic attacks, sense of great danger, suicidal thoughts/plans, thoughts of violence, and unusual visions or sounds; in therapy for ptsd through the [place]. Endo:  Denies cold intolerance, excessive hunger, excessive thirst, excessive urination, heat intolerance, polyuria, and weight change. Heme:  Denies abnormal bruising and bleeding. Allergy:  Denies hives or rash and itching eyes.  Physical Exam  General:  Well-developed,obese,in no acute distress; alert,appropriate and cooperative throughout examination HEENT: No facial asymmetry,  EOMI, No sinus tenderness, TM's Clear, oropharynx  pink and moist.   Chest: Clear to auscultation bilaterally.  CVS: S1, S2, No murmurs, No S3.   Abd: Soft, Nontender.  MS: decreased  ROM spine,adequate in  hips, shoulders and knees.  Ext: No edema.  CNS: CN 2-12 intact, power tone and sensation normal throughout.   Skin: Intact, no visible lesions or rashes.  Psych: Good eye contact, normal affect.  Memory intact, anxious  appearing.    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   Problem # 5:  HYPERTENSION (ICD-401.9) Assessment: Improved  The following medications were removed from the medication list:    Nifedical Xl 60 Mg Xr24h-tab (Nifedipine) .Marland Kitchen... Take 1 tablet by mouth once a day His updated medication list for this problem includes:    Hydrochlorothiazide 25 Mg Tabs (Hydrochlorothiazide) .Marland Kitchen... Take 1 tablet by mouth once a day  Orders: CXR- 2view (CXR) T-Basic Metabolic Panel ([id number])  BP today: 140/78 Prior BP: 150/90 (07/29/2008)  Labs Reviewed: K+: 4.3 (08/02/2008) Creat: : 1.01 (08/02/2008)   Chol: 257 (08/02/2008)   HDL: 46 (08/02/2008)   LDL: 187 (08/02/2008)   TG: 122 (08/02/2008)  Problem # 6:  HYPERLIPIDEMIA (ICD-272.4) Assessment: Comment Only  His updated medication list for this problem includes:    Simvastatin 40 Mg Tabs (Simvastatin) .Marland Kitchen... Take 1 tablet by mouth once a day  Orders: T-Hepatic Function [id number]) past due needs to do asap T-Lipid Profile [id number])  Labs Reviewed: SGOT: 20 (08/02/2008)   SGPT: 18 (08/02/2008)   HDL:46 (08/02/2008), 46 (01/20/2008)  LDL:187 (08/02/2008), 145 (01/20/2008)  Chol:257 (08/02/2008), 255 (01/20/2008)  Trig:122 (08/02/2008), 320 (01/20/2008)  Problem # 7:  OBESITY (ICD-278.00) Assessment: Deteriorated  Ht: 72 (01/06/2009)   Wt: 262.75 (01/06/2009)   BMI: 35.76 (01/06/2009)  Complete Medication List: 1)  Ibuprofen 800 Mg Tabs (Ibuprofen) .... One tab by mouth bid 2)  Flomax 0.4 Mg Xr24h-cap (Tamsulosin hcl) .... One tab by mouth qd 3)  Simvastatin 40 Mg Tabs (Simvastatin) .... Take 1 tablet by mouth once a day 4)  Hydrochlorothiazide 25 Mg Tabs (Hydrochlorothiazide) .... Take 1 tablet by mouth once a day  Other Orders: T-CBC w/Diff ([id number]) T-PSA ([id number]) T-TSH ([id number]) Gastroenterology Referral (GI)  Patient Instructions: 1)  Please schedule a follow-up  appointment in 4 months. 2)  BMP prior to visit, ICD-9: 3)  Hepatic Panel prior to visit, ICD-9: 4)  Lipid Panel prior to visit, ICD-9: 5)  TSH prior to visit, ICD-9:   fasting labs Jan 19 or after 6)  CBC w/ Diff prior to visit, ICD-9: 7)  PSA prior to visit, ICD-9:

## 2010-01-31 NOTE — Letter (Signed)
Summary: Internal Other /triage/RX/instructions  Internal Other /triage/RX/instructions   Imported By: Cloria Spring LPN [id number] 38:75:64  _____________________________________________________________________  External Attachment:    Type:   Image     Comment:   External Document

## 2010-01-31 NOTE — Assessment & Plan Note (Signed)
Summary: joint pain- room 2   Vital Signs:  Patient profile:   65 year old male Height:      72 inches Weight:      278.50 pounds BMI:     37.91 O2 Sat:      99 % on Room air Pulse rate:   72 / minute Resp:     16 per minute BP sitting:   128 / 70  (left arm)  Vitals Entered By: Adella Hare LPN (April 11, 2009 9:55 AM)  Nutrition Counseling: Patient's BMI is greater than 25 and therefore counseled on weight management options. CC: arthritis joint pain Is Patient Diabetic? No Comments patient is also supposed to be on bp and cholesterol meds but not compliant   Primary Provider:  Syliva Overman MD  CC:  arthritis joint pain.  History of Present Illness: Pt is here today with c/o flare up of his arthritis pain.  Rx Ibuprofen is helping more than the otc.  Injections in the past has helped too & he would like to get these today.  He reports arthritis in his Rt ankle, neck, low back, Lt shoulder & hands.  He has been tested for high uric acid levels in the past, & has no hx of gout.  Has also gained wt with d/c of smoking.  Started smoking cigars recently because of cigarrette craving & wt gain.  He states he never feels full & foods taste so much better that all he does is eat.  He is wondering if he can get a prescription to help with his appetitie.  He has not been taking his cholesterol medication or BPH meds regularly.  States he recently started them again.  His HTN meds were d/c in the past.     Current Medications (verified): 1)  Ibuprofen 800 Mg Tabs (Ibuprofen) .... One Tab By Mouth Bid  Allergies (verified): No Known Drug Allergies  Past History:  Past medical history reviewed for relevance to current acute and chronic problems.  Past Medical History: Reviewed history from 09/18/2007 and no changes required. Current Problems:  NICOTINE ADDICTION (ICD-305.1) OBESITY (ICD-278.00) HYPERLIPIDEMIA (ICD-272.4) HYPERTENSION (ICD-401.9) BPH  Review of  Systems General:  Denies loss of appetite. CV:  Denies chest pain or discomfort and palpitations. Resp:  Denies shortness of breath. GI:  Denies indigestion and loss of appetite. MS:  Complains of joint pain, joint swelling, and low back pain; denies joint redness and muscle weakness. Endo:  Complains of excessive hunger.  Physical Exam  General:  Well-developed,well-nourished,in no acute distress; alert,appropriate and cooperative throughout examination Head:  Normocephalic and atraumatic without obvious abnormalities. No apparent alopecia or balding. Ears:  External ear exam shows no significant lesions or deformities.  Otoscopic examination reveals clear canals, tympanic membranes are intact bilaterally without bulging, retraction, inflammation or discharge. Hearing is grossly normal bilaterally. Nose:  External nasal examination shows no deformity or inflammation. Nasal mucosa are pink and moist without lesions or exudates. Mouth:  Oral mucosa and oropharynx without lesions or exudates.   Neck:  No deformities, masses, or tenderness noted.no thyromegaly.   Lungs:  Normal respiratory effort, chest expands symmetrically. Lungs are clear to auscultation, no crackles or wheezes. Heart:  Normal rate and regular rhythm. S1 and S2 normal without gallop, murmur, click, rub or other extra sounds. Msk:  normal ROM, no joint tenderness, no joint swelling, no joint warmth, and no redness over joints.   Pulses:  R radial normal, R posterior tibial normal, R dorsalis  pedis normal, L radial normal, L posterior tibial normal, and L dorsalis pedis normal.   Extremities:  No clubbing, cyanosis, edema, or deformity noted with normal full range of motion of all joints.   Neurologic:  alert & oriented X3, sensation intact to light touch, gait normal, and DTRs symmetrical and normal.   Skin:  Intact without suspicious lesions or rashes Cervical Nodes:  No lymphadenopathy noted Psych:  Cognition and judgment  appear intact. Alert and cooperative with normal attention span and concentration. No apparent delusions, illusions, hallucinations   Impression & Recommendations:  Problem # 1:  ARTHRITIS (ICD-716.90) Assessment Deteriorated  Orders: Depo- Medrol 80mg  (J1040) Ketorolac-Toradol 15mg  (Z6109) Admin of Therapeutic Inj  intramuscular or subcutaneous (60454)  Problem # 2:  HYPERTENSION (ICD-401.9) Assessment: Improved  The following medications were removed from the medication list:    Hydrochlorothiazide 25 Mg Tabs (Hydrochlorothiazide) .Marland Kitchen... Take 1 tablet by mouth once a day  BP today: 128/70 Prior BP: 140/78 (01/06/2009)  Labs Reviewed: K+: 4.3 (08/02/2008) Creat: : 1.01 (08/02/2008)   Chol: 257 (08/02/2008)   HDL: 46 (08/02/2008)   LDL: 187 (08/02/2008)   TG: 122 (08/02/2008)  Orders: T-Comprehensive Metabolic Panel (09811-91478)  Problem # 3:  HYPERLIPIDEMIA (ICD-272.4) Assessment: Comment Only  His updated medication list for this problem includes:    Simvastatin 40 Mg Tabs (Simvastatin) .Marland Kitchen... Take 1 tablet by mouth once a day  Orders: T-Lipid Profile (29562-13086)  Problem # 4:  OBESITY (ICD-278.00)  Orders: T- Hemoglobin A1C (57846-96295)  Ht: 72 (04/11/2009)   Wt: 278.50 (04/11/2009)   BMI: 37.91 (04/11/2009)  Complete Medication List: 1)  Ibuprofen 800 Mg Tabs (Ibuprofen) .... One tab by mouth bid 2)  Flomax 0.4 Mg Xr24h-cap (Tamsulosin hcl) .... One tab by mouth qd 3)  Simvastatin 40 Mg Tabs (Simvastatin) .... Take 1 tablet by mouth once a day 4)  Phentermine Hcl 37.5 Mg Caps (Phentermine hcl) .... Take one daily in am  Other Orders: T-CBC No Diff (28413-24401) T-PSA (02725-36644) T-TSH [phone]) T-Vitamin D (25-Hydroxy) [phone])  Patient Instructions: 1)  Keep your appt in May. 2)  Tobacco is very bad for your health and your loved ones! You Should stop smoking!. 3)  Stop Smoking Tips: Choose a Quit date. Cut down before the Quit date.  decide what you will do as a substitute when you feel the urge to smoke(gum,toothpick,exercise). 4)  It is important that you exercise regularly at least 20 minutes 5 times a week. If you develop chest pain, have severe difficulty breathing, or feel very tired , stop exercising immediately and seek medical attention. 5)  You need to lose weight. Consider a lower calorie diet and regular exercise.  6)  You have received a shot of Depo Medrol today to help with arthritis, and Toradol for pain. Prescriptions: PHENTERMINE HCL 37.5 MG CAPS (PHENTERMINE HCL) take one daily in am  #30 x 0   Entered and Authorized by:   Esperanza Sheets PA   Signed by:   Esperanza Sheets PA on 04/11/2009   Method used:   Printed then faxed to ...       [facility]  [address] [phone]* (retail)       [address]       [place], [place]  [address]       Ph: [phone] or [phone]       Fax: [phone]   RxID:   [id number]    Medication Administration  Injection #  1:    Medication: Depo- Medrol 80mg     Diagnosis: ARTHRITIS (ICD-716.90)    Route: IM    Site: RUOQ gluteus    Exp Date: 11/11    Lot #: [id number]    Mfr: Pharmacia    Patient tolerated injection without complications    Given by: Adella Hare LPN (April 11, 2009 10:54 AM)  Injection # 2:    Medication: Ketorolac-Toradol 15mg     Diagnosis: ARTHRITIS (ICD-716.90)    Route: IM    Site: LUOQ gluteus    Exp Date: 08/02/2010    Lot #: [id number]    Mfr: [email or web address]    Comments: toradol 60mg  given    Patient tolerated injection without complications    Given by: Adella Hare LPN (April 11, 2009 10:55 AM)  Orders Added: 1)  T-Lipid Profile [80061-22930] 2)  T-CBC No Diff [85027-10000] 3)  T-PSA [45409-81191] 4)  T-TSH [47829-56213] 5)  T-Vitamin D (25-Hydroxy) [08657-84696] 6)  T- Hemoglobin A1C [83036-23375] 7)  T-Comprehensive Metabolic Panel [80053-22900] 8)  Depo- Medrol 80mg  [J1040] 9)  Ketorolac-Toradol 15mg  [J1885] 10)  Admin of  Therapeutic Inj  intramuscular or subcutaneous [96372] 11)  Est. Patient Level IV [29528]

## 2010-02-02 ENCOUNTER — Ambulatory Visit (INDEPENDENT_AMBULATORY_CARE_PROVIDER_SITE_OTHER): Payer: Medicare Other | Admitting: Family Medicine

## 2010-02-02 ENCOUNTER — Encounter: Payer: Self-pay | Admitting: Family Medicine

## 2010-02-02 DIAGNOSIS — I10 Essential (primary) hypertension: Secondary | ICD-10-CM

## 2010-02-02 DIAGNOSIS — M109 Gout, unspecified: Secondary | ICD-10-CM

## 2010-02-02 DIAGNOSIS — E785 Hyperlipidemia, unspecified: Secondary | ICD-10-CM

## 2010-02-02 NOTE — Assessment & Plan Note (Signed)
Summary: office visit   Vital Signs:  Patient profile:   65 year old male Height:      72 inches Weight:      265.50 pounds BMI:     36.14 O2 Sat:      97 % Pulse rate:   78 / minute Pulse rhythm:   regular Resp:     16 per minute BP sitting:   160 / 90  (left arm)  Vitals Entered By: Everitt Amber LPN (January 17, 2010 10:34 AM)  Nutrition Counseling: Patient's BMI is greater than 25 and therefore counseled on weight management options. CC: Follow up chronic problems   Primary Care Provider:  Syliva Overman MD  CC:  Follow up chronic problems.  History of Present Illness: c/o intolerance to all his meds esp the phentermine and codeine , rash and itch , poor sleep and nightmares, stopped everythimg. Poor urinary stream, also Ed , wants the flomax back.  Wants to quit smoking,feels it makes him tired, smokes 30/day  Denies recent fever or chills. Denies sinus pressure, nasal congestion , ear pain or sore throat. Denies chest congestion, or cough productive of sputum. Denies chest pain, palpitations, PND, orthopnea or leg swelling. Denies abdominal pain, nausea, vomitting, diarrhea or constipation. Denies change in bowel movements or bloody stool. Denies dysuria , frequency, incontinence or hesitancy.  Denies headaches, vertigo, seizures.  Denies  rash, lesions, or itch.     Preventive Screening-Counseling & Management  Alcohol-Tobacco     Smoking Cessation Counseling: yes  Current Medications (verified): 1)  None  Allergies (verified): No Known Drug Allergies  Review of Systems General:  Complains of fatigue and sleep disorder. Eyes:  Denies discharge and red eye. MS:  Complains of joint pain, low back pain, mid back pain, and stiffness. 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  Physical  Exam  General:  Well-developed,well-nourished,in no acute distress; alert,appropriate and cooperative throughout examination HEENT: No facial asymmetry,  EOMI, No sinus tenderness, TM's Clear, oropharynx  pink and moist.   Chest: Clear to auscultation bilaterally. decreased air entry bilaterally CVS: S1, S2, No murmurs, No S3.   Abd: Soft, Nontender.  MS: decreased  ROM spine,adequate in hips, shoulders and knees.  Ext: No edema.   CNS: CN 2-12 intact, power tone and sensation normal throughout.   Skin: Intact, no visible lesions or rashes.  Psych: Good eye contact, normal affect.  Memory intact, not anxious or depressed appearing.    Impression & Recommendations:  Problem # 1:  ERECTILE DYSFUNCTION, NON-ORGANIC (ICD-302.72) Assessment Deteriorated  Orders: T-Testosterone; Total [phone])  Problem # 2:  DEPRESSION, CHRONIC (ICD-311) Assessment: Improved  Problem # 3:  HYPERLIPIDEMIA (ICD-272.4) Assessment: Comment Only  The following medications were removed from the medication list:    Lovastatin 40 Mg Tabs (Lovastatin) .Marland Kitchen... Take 1 tab by mouth at bedtime Low fat dietdiscussed and encouraged  Orders: T-Lipid Profile (09811-91478) T-Hepatic Function [phone])  Labs Reviewed: SGOT: 21 (05/19/2009)   SGPT: 19 (05/19/2009)  Lipid Goals: Chol Goal: 200 (06/02/2009)   HDL Goal: 40 (06/02/2009)   LDL Goal: 70 (06/02/2009)   TG Goal: 150 (06/02/2009)  Prior 10 Yr Risk Heart Disease: 22 % (06/02/2009)   HDL:33 (09/06/2009), 52 (05/19/2009)  LDL:See Comment mg/dL (57/84/6962), 952 (84/13/2440)  Chol:230 (09/06/2009), 227 (05/19/2009)  Trig:469 (09/06/2009), 94 (05/19/2009)  Problem # 4:  HYPERTENSION (  ICD-401.9) Assessment: Deteriorated  His updated medication list for this problem includes:    Amlodipine Besylate 5 Mg Tabs (Amlodipine besylate) .Marland Kitchen... Take 1 tablet by mouth once a day  Orders: Medicare Electronic Prescription [phone]) CXR- 2view (CXR) T-Basic  Metabolic Panel (60454-09811)  BP today: 160/90 Prior BP: 140/80 (09/06/2009)  Prior 10 Yr Risk Heart Disease: 22 % (06/02/2009)  Labs Reviewed: K+: 4.5 (09/06/2009) Creat: : 1.10 (09/06/2009)   Chol: 230 (09/06/2009)   HDL: 33 (09/06/2009)   LDL: See Comment mg/dL (91/47/8295)   TG: 621 (09/06/2009)  Complete Medication List: 1)  Flomax 0.4 Mg Caps (Tamsulosin hcl) .... Take 1 capsule by mouth once a day 2)  Amlodipine Besylate 5 Mg Tabs (Amlodipine besylate) .... Take 1 tablet by mouth once a day 3)  Asprin 81mg   .... One daily 4)  Multivitamins Tabs (Multiple vitamin) .... One tablet once daily 5)  Calcium With Vit D 1200mg /100iu Gelcap  .... One daily  Other Orders: T- Hemoglobin A1C (30865-78469) T-Uric Acid (Blood) [phone]) T-Vitamin D (25-Hydroxy) [phone])  Patient Instructions: 1)  F/U in 5 to 6 weeks 2)  Tobacco is very bad for your health and your loved ones! You Should stop smoking!. 3)  Stop Smoking Tips: Choose a Quit date. Cut down before the Quit date. decide what you will do as a substitute when you feel the urge to smoke(gum,toothpick,exercise). 4)  It is important that you exercise regularly at least 20 minutes 5 times a week. If you develop chest pain, have severe difficulty breathing, or feel very tired , stop exercising immediately and seek medical attention. 5)  You need to You have gained 7 pounds 6)  Your blood pressure is high pls startt the med prescribed. 7)  cXR today. 8)  Flomax to be resumed. 9)  Pls take 1 baby asprin, 1 calcium with vit D, and one multivitamin daily 10)  BMP prior to visit, ICD-9: 11)  Hepatic Panel prior to visit, ICD-9: 12)  Lipid Panel prior to visit, ICD-9: 13)  HbgA1C prior to visit, ICD-9: 14)  testosteron and vit d and uric acid level Prescriptions: AMLODIPINE BESYLATE 5 MG TABS (AMLODIPINE BESYLATE) Take 1 tablet by mouth once a day  #30 x 2   Entered and Authorized by:   Syliva Overman MD   Signed by:    Syliva Overman MD on 01/17/2010   Method used:   [facility] to        [facility] [phone]* (retail)       [address]       [place], [place]  [address]       Ph: [phone]       Fax: [phone]   RxID:   [id number] FLOMAX 0.4 MG CAPS (TAMSULOSIN HCL) Take 1 capsule by mouth once a day  #30 x 2   Entered and Authorized by:   Syliva Overman MD   Signed by:   Syliva Overman MD on 01/17/2010   Method used:   [facility] to        [facility] [phone]* (retail)       [address]       [place], [place]  [address]       Ph: [phone]       Fax: [phone]   RxID:   [id number]    Orders Added: 1)  Est. Patient Level IV [03500] 2)  Medicare Electronic Prescription [G8553] 3)  CXR- 2view [CXR] 4)  T-Basic Metabolic Panel [80048-22910] 5)  T-Lipid Profile [80061-22930] 6)  T-Hepatic Function [80076-22960] 7)  T- Hemoglobin A1C [83036-23375] 8)  T-Testosterone; Total [phone] 9)  T-Uric Acid (Blood) [84550-23180] 10)  T-Vitamin D (25-Hydroxy) [phone]

## 2010-02-08 NOTE — Assessment & Plan Note (Signed)
Summary: gout in both feet   Vital Signs:  Patient profile:   65 year old male Height:      72 inches Weight:      262.25 pounds BMI:     35.70 O2 Sat:      96 % Pulse rate:   77 / minute Pulse rhythm:   regular Resp:     16 per minute BP sitting:   160 / 98  (left arm) Cuff size:   large  Vitals Entered By: Everitt Amber LPN (February 02, 2010 9:24 AM)  Nutrition Counseling: Patient's BMI is greater than 25 and therefore counseled on weight management options. CC: Having pain in both feet from gout for over a week in right foot and it started yesterday in left foot   Primary Care Provider:  Syliva Overman MD  CC:  Having pain in both feet from gout for over a week in right foot and it started yesterday in left foot.  History of Present Illness: One and a half week h/o joint pain and swelling affecting the great toes, pt unable to weight bear without pain.He has gout and has been off meds voluntarily. Reports  that he has otherwise been  doing well. Denies recent fever or chills. Denies sinus pressure, nasal congestion , ear pain or sore throat. Denies chest congestion, or cough productive of sputum. Denies chest pain, palpitations, PND, orthopnea or leg swelling. Denies abdominal pain, nausea, vomitting, diarrhea or constipation. Denies change in bowel movements or bloody stool. Denies dysuria , frequency, incontinence or hesitancy.  Denies headaches, vertigo, seizures. Denies depression, anxiety or insomnia. Denies  rash, lesions, or itch. He is changeing his diet and has lost weight. He ios down to 10 ciggs/day      Preventive Screening-Counseling & Management  Alcohol-Tobacco     Smoking Cessation Counseling: yes  Current Medications (verified): 1)  Flomax 0.4 Mg Caps (Tamsulosin Hcl) .... Take 1 Capsule By Mouth Once A Day 2)  Amlodipine Besylate 5 Mg Tabs (Amlodipine Besylate) .... Take 1 Tablet By Mouth Once A Day 3)  Asprin 81mg  .... One Daily 4)   Multivitamins  Tabs (Multiple Vitamin) .... One Tablet Once Daily 5)  Calcium With Vit D 1200mg /100iu Gelcap .... One Daily  Allergies (verified): No Known Drug Allergies  Review of Systems      See HPI Eyes:  Denies blurring and discharge. MS:  Complains of joint pain and stiffness; swollen right great toe and painful x 10 days, , 3 day h/o left grt toe pain and swelling, has bee  n off gout meds wants and needs to resume. Psych:  Denies anxiety and depression. Endo:  Denies cold intolerance, excessive hunger, excessive thirst, excessive urination, and polyuria. Heme:  Denies abnormal bruising, bleeding, enlarge lymph nodes, and fevers. Allergy:  Denies hives or rash and itching eyes.  Physical Exam  General:  Well-developedobese,in no acute distress; alert,appropriate and cooperative throughout examination. Pt in pain HEENT: No facial asymmetry,  EOMI, No sinus tenderness, TM's Clear, oropharynx  pink and moist.   Chest: Clear to auscultation bilaterally.  CVS: S1, S2, No murmurs, No S3.   Abd: Soft, Nontender.  MS: Adequate ROM spine, hips, shoulders and knees.both great towes red hot and swollen at the tarsal joints  Ext: No edema.   CNS: CN 2-12 intact, power tone and sensation normal throughout.   Skin: Intact, no visible lesions or rashes.  Psych: Good eye contact, normal affect.  Memory intact, not anxious  or depressed appearing.    Impression & Recommendations:  Problem # 1:  GOUT (ICD-274.9) Assessment Deteriorated  His updated medication list for this problem includes:    Allopurinol 300 Mg Tabs (Allopurinol) .Marland Kitchen... Take 1 tablet by mouth once a day  Orders: Medicare Electronic Prescription [phone]) Depo- Medrol 80mg  (J1040) Ketorolac-Toradol 15mg  (U0454) Admin of Therapeutic Inj  intramuscular or subcutaneous (09811)  Problem # 2:  OBESITY (ICD-278.00) Assessment: Improved  Ht: 72 (02/02/2010)   Wt: 262.25 (02/02/2010)   BMI: 35.70 (02/02/2010)  Problem #  3:  NICOTINE ADDICTION (ICD-305.1) Assessment: Improved  Encouraged smoking cessation and discussed different methods for smoking cessation.   Problem # 4:  HYPERTENSION (ICD-401.9) Assessment: Unchanged  His updated medication list for this problem includes:    Amlodipine Besylate 5 Mg Tabs (Amlodipine besylate) .Marland Kitchen... Take 1 tablet by mouth once a day  BP today: 160/98 Prior BP: 160/90 (01/17/2010)  Prior 10 Yr Risk Heart Disease: 22 % (06/02/2009)  Labs Reviewed: K+: 4.5 (09/06/2009) Creat: : 1.10 (09/06/2009)   Chol: 230 (09/06/2009)   HDL: 33 (09/06/2009)   LDL: See Comment mg/dL (91/47/8295)   TG: 621 (09/06/2009)  Complete Medication List: 1)  Flomax 0.4 Mg Caps (Tamsulosin hcl) .... Take 1 capsule by mouth once a day 2)  Amlodipine Besylate 5 Mg Tabs (Amlodipine besylate) .... Take 1 tablet by mouth once a day 3)  Asprin 81mg   .... One daily 4)  Multivitamins Tabs (Multiple vitamin) .... One tablet once daily 5)  Calcium With Vit D 1200mg /100iu Gelcap  .... One daily 6)  Allopurinol 300 Mg Tabs (Allopurinol) .... Take 1 tablet by mouth once a day 7)  Indomethacin 25 Mg Caps (Indomethacin) .... One tablet 3 times daily for 2 days, then one twice daily for 2 days, then one daily for 3 days 8)  Prednisone (pak) 5 Mg Tabs (Prednisone) .... Use as directed 9)  Vicodin 5-500 Mg Tabs (Hydrocodone-acetaminophen) .... Take 1 tablet by mouth two times a day as needed for uncontrolled pain  Patient Instructions: 1)  f/u as before. 2)  injections today for acute gout, and meds are sent in. 3)  Tobacco is very bad for your health and your loved ones! You Should stop smoking!. 4)  Stop Smoking Tips: Choose a Quit date. Cut down before the Quit date. decide what you will do as a substitute when you feel the urge to smoke(gum,toothpick,exercise). 5)  It is important that you exercise regularly at least 20 minutes 5 times a week. If you develop chest pain, have severe difficulty breathing,  or feel very tired , stop exercising immediately and seek medical attention. 6)  You need to lose weight. Consider a lower calorie diet and regular exercise.  Prescriptions: VICODIN 5-500 MG TABS (HYDROCODONE-ACETAMINOPHEN) Take 1 tablet by mouth two times a day as needed for uncontrolled pain  #30 x 0   Entered and Authorized by:   Syliva Overman MD   Signed by:   Syliva Overman MD on 02/02/2010   Method used:   Printed then [facility]ed [facility] ...       [facility]  [address] [phone][facility] (retail)       [address]       [place], [place]  [address]       Ph: [phone]       Fax: [phone]   RxID:   [id number] PREDNISONE (PAK) 5 MG TABS (PREDNISONE) Use as directed  #21 x  0   Entered and Authorized by:   Syliva Overman MD   Signed by:   Syliva Overman MD on 02/02/2010   Method used:   Electronically to        [facility] [phone]* (retail)       [address]       [place], [place]  [address]       Ph: [phone]       Fax: [phone]   RxID:   [id number] INDOMETHACIN 25 MG CAPS (INDOMETHACIN) one tablet 3 times daily for 2 days, then one twice daily for 2 days, then one daily for 3 days  #13 x 0   Entered and Authorized by:   Syliva Overman MD   Signed by:   Syliva Overman MD on 02/02/2010   Method used:   Electronically to        [facility] [phone]* (retail)       [address]       [place], [place]  [address]       Ph: [phone]       Fax: [phone]   RxID:   [id number] ALLOPURINOL 300 MG TABS (ALLOPURINOL) Take 1 tablet by mouth once a day  #30 x 3   Entered and Authorized by:   Syliva Overman MD   Signed by:   Syliva Overman MD on 02/02/2010   Method used:   Electronically to        [facility] [phone]* (retail)       [address]       [place], [place]  [address]       Ph: [phone]       Fax: [phone]   RxID:   [id number]    Medication  Administration  Injection # 1:    Medication: Depo- Medrol 80mg     Diagnosis: GOUT (ICD-274.9)    Route: IM    Site: LUOQ gluteus    Exp Date: 07/12    Lot #: Gunnar [id number]    Mfr: Pharmacia    Patient tolerated injection without complications    Given by: Adella Hare LPN (February 02, 2010 9:50 AM)  Injection # 2:    Medication: Ketorolac-Toradol 15mg     Diagnosis: GOUT (ICD-274.9)    Route: IM    Site: RUOQ gluteus    Exp Date: 06/02/2011    Lot #: [id number]    Mfr: [email or web address]    Comments: toradol 60mg  given    Patient tolerated injection without complications    Given by: Adella Hare LPN (February 02, 2010 9:51 AM)  Orders Added: 1)  Est. Patient Level IV [46270] 2)  Medicare Electronic Prescription [G8553] 3)  Depo- Medrol 80mg  [J1040] 4)  Ketorolac-Toradol 15mg  [J1885] 5)  Admin of Therapeutic Inj  intramuscular or subcutaneous [96372]     Medication Administration  Injection # 1:    Medication: Depo- Medrol 80mg     Diagnosis: GOUT (ICD-274.9)    Route: IM    Site: LUOQ gluteus    Exp Date: 07/12    Lot #: Gunnar Bulla    Mfr: Pharmacia    Patient tolerated injection without complications    Given by: Adella Hare LPN (February 02, 2010 9:50 AM)  Injection # 2:    Medication: Ketorolac-Toradol 15mg     Diagnosis: GOUT (ICD-274.9)  Route: IM    Site: RUOQ gluteus    Exp Date: 06/02/2011    Lot #: [id number]    Mfr: [email or web address]    Comments: toradol 60mg  given    Patient tolerated injection without complications    Given by: Adella Hare LPN (February 02, 2010 9:51 AM)  Orders Added: 1)  Est. Patient Level IV [24401] 2)  Medicare Electronic Prescription [G8553] 3)  Depo- Medrol 80mg  [J1040] 4)  Ketorolac-Toradol 15mg  [J1885] 5)  Admin of Therapeutic Inj  intramuscular or subcutaneous [02725]

## 2010-02-28 ENCOUNTER — Other Ambulatory Visit: Payer: Self-pay | Admitting: Family Medicine

## 2010-02-28 ENCOUNTER — Ambulatory Visit (HOSPITAL_COMMUNITY)
Admission: RE | Admit: 2010-02-28 | Discharge: 2010-02-28 | Disposition: A | Discharge status: Home / Self Care | Payer: Medicare Other | Source: Ambulatory Visit | Attending: Family Medicine | Admitting: Family Medicine

## 2010-02-28 DIAGNOSIS — F17200 Nicotine dependence, unspecified, uncomplicated: Secondary | ICD-10-CM

## 2010-02-28 DIAGNOSIS — I10 Essential (primary) hypertension: Secondary | ICD-10-CM | POA: Insufficient documentation

## 2010-02-28 DIAGNOSIS — R059 Cough, unspecified: Secondary | ICD-10-CM | POA: Insufficient documentation

## 2010-02-28 DIAGNOSIS — E785 Hyperlipidemia, unspecified: Secondary | ICD-10-CM | POA: Insufficient documentation

## 2010-02-28 DIAGNOSIS — M109 Gout, unspecified: Secondary | ICD-10-CM | POA: Insufficient documentation

## 2010-02-28 DIAGNOSIS — R05 Cough: Secondary | ICD-10-CM | POA: Insufficient documentation

## 2010-03-01 ENCOUNTER — Encounter: Payer: Self-pay | Admitting: Family Medicine

## 2010-03-01 ENCOUNTER — Ambulatory Visit (INDEPENDENT_AMBULATORY_CARE_PROVIDER_SITE_OTHER): Payer: Medicare Other | Admitting: Family Medicine

## 2010-03-01 ENCOUNTER — Ambulatory Visit: Payer: Medicare Other | Admitting: Family Medicine

## 2010-03-01 ENCOUNTER — Telehealth: Payer: Self-pay | Admitting: Family Medicine

## 2010-03-01 DIAGNOSIS — E785 Hyperlipidemia, unspecified: Secondary | ICD-10-CM

## 2010-03-01 DIAGNOSIS — I10 Essential (primary) hypertension: Secondary | ICD-10-CM

## 2010-03-01 DIAGNOSIS — IMO0002 Reserved for concepts with insufficient information to code with codable children: Secondary | ICD-10-CM

## 2010-03-01 LAB — CONVERTED CEMR LAB
ALT: 18 units/L (ref 0–53)
AST: 23 units/L (ref 0–37)
Bilirubin, Direct: 0.1 mg/dL (ref 0.0–0.3)
Calcium: 9.9 mg/dL (ref 8.4–10.5)
Cholesterol: 244 mg/dL — ABNORMAL HIGH (ref 0–200)
Glucose, Bld: 102 mg/dL — ABNORMAL HIGH (ref 70–99)
Hgb A1c MFr Bld: 6.4 % — ABNORMAL HIGH (ref <5.7)
Potassium: 4.4 meq/L (ref 3.5–5.3)
Sodium: 138 meq/L (ref 135–145)
Total CHOL/HDL Ratio: 5.3
Total Protein: 8.2 g/dL (ref 6.0–8.3)
Triglycerides: 180 mg/dL — ABNORMAL HIGH (ref <150)
Uric Acid, Serum: 7.6 mg/dL (ref 4.0–7.8)
VLDL: 36 mg/dL (ref 0–40)

## 2010-03-14 NOTE — Progress Notes (Signed)
  Phone Note From Pharmacy   [facility]  [address] [phone][facility] Summary of Call: spoke with pharmacist regarding androderm and it is not a coverage issue, patient may owe a deductable, price of med will be $345 what is alternative Initial call taken by: Adella Hare LPN,  March 01, 2010 4:09 PM  Follow-up for Phone Call        pls chjeck on the cost to him of the vial of testosterone, which he would have to buy and we administer here, let pt know the costs involved so he can decide what he wants to do/is able to do Follow-up by: Syliva Overman MD,  March 02, 2010 2:26 PM  Additional Follow-up for Phone Call Additional follow up Details #1::        pharmacy will not know price without it being ordered and ran through Additional Follow-up by: Adella Hare LPN,  March 02, 2010 2:46 PM    Additional Follow-up for Phone Call Additional follow up Details #2::    new rx written, unsure of the #of cc in the vial, so pls veruify with the pharmacy , and change that if it becomes a problem Follow-up by: Syliva Overman MD,  March 03, 2010 8:09 AM  New/Updated Medications: DEPO-TESTOSTERONE 100 MG/ML OIL (TESTOSTERONE CYPIONATE) 50mg  IM every 2 weeks Prescriptions: DEPO-TESTOSTERONE 100 MG/ML OIL (TESTOSTERONE CYPIONATE) 50mg  IM every 2 weeks  #1 mnth x 0   Entered by:   Adella Hare LPN   Authorized by:   Syliva Overman MD   Signed by:   Adella Hare LPN on 96/04/5407   Method used:   Printed then faxed to ...       [facility] [phone]* (retail)       [address]       [place], [place]  [address]       Ph: [phone]       Fax: [phone]   RxID:   [id number] DEPO-TESTOSTERONE 100 MG/ML OIL (TESTOSTERONE CYPIONATE) 50mg  IM every 2 weeks  #78ml x 0   Entered and Authorized by:   Syliva Overman MD   Signed by:   Syliva Overman MD on 03/03/2010   Method used:   Historical   RxID:   [id number]  called patient, left message Adella Hare LPN  March 06, 2010 10:12 AM called patient, left message Adella Hare LPN  March 06, 5641 10:02 AM patient aware Adella Hare LPN  March 06, 3293 3:23 PM

## 2010-03-14 NOTE — Assessment & Plan Note (Signed)
Summary: f up   Vital Signs:  Patient profile:   65 year old male Height:      72 inches Weight:      267 pounds BMI:     36.34 O2 Sat:      98 % Pulse rate:   66 / minute Pulse rhythm:   regular Resp:     16 per minute BP sitting:   120 / 72  (left arm) Cuff size:   large  Vitals Entered By: Everitt Amber LPN (March 01, 2010 9:30 AM)  Nutrition Counseling: Patient's BMI is greater than 25 and therefore counseled on weight management options. CC: Follow up chronic problems   Primary Care Provider:  Syliva Overman MD  CC:  Follow up chronic problems.  History of Present Illness: Reports  that he is doing fairly well. Denies recent fever or chills. Denies sinus pressure, nasal congestion , ear pain or sore throat. Denies chest congestion, or cough productive of sputum. Denies chest pain, palpitations, PND, orthopnea or leg swelling. Denies abdominal pain, nausea, vomitting, diarrhea or constipation. Denies change in bowel movements or bloody stool. Denies dysuria , frequency, incontinence or hesitancy.  Denies headaches, vertigo, seizures.  Denies  rash, lesions, or itch.     Preventive Screening-Counseling & Management  Alcohol-Tobacco     Smoking Cessation Counseling: yes  Current Medications (verified): 1)  Flomax 0.4 Mg Caps (Tamsulosin Hcl) .... Take 1 Capsule By Mouth Once A Day 2)  Amlodipine Besylate 5 Mg Tabs (Amlodipine Besylate) .... Take 1 Tablet By Mouth Once A Day 3)  Asprin 81mg  .... One Daily 4)  Calcium With Vit D 1200mg /100iu Gelcap .... One Daily 5)  Allopurinol 300 Mg Tabs (Allopurinol) .... Take 1 Tablet By Mouth Once A Day 6)  Vicodin 5-500 Mg Tabs (Hydrocodone-Acetaminophen) .... Take 1 Tablet By Mouth Two Times A Day As Needed For Uncontrolled Pain 7)  Colcrys 0.6 Mg Tabs (Colchicine) .... Take 1 Tablet By Mouth Two Times A Day 8)  Ibuprofen 200 Mg Tabs (Ibuprofen) .... 4 Tabs A Day  Allergies (verified): No Known Drug  Allergies  Review of Systems      See HPI General:  Complains of fatigue; chronic fatigue and low sex drive. Eyes:  Denies blurring and discharge. MS:  Complains of low back pain and mid back pain; chronic back pain, no recent gout flares. Psych:  Complains of anxiety and mental problems; denies suicidal thoughts/plans; has ptSD but symptoms are improving, off meds. Endo:  Denies cold intolerance, excessive hunger, excessive thirst, excessive urination, and heat intolerance. Heme:  Denies abnormal bruising, bleeding, enlarge lymph nodes, and fevers. Allergy:  Complains of seasonal allergies; denies hives or rash and itching eyes.  Physical Exam  General:  Well-developedobese,in no acute distress; alert,appropriate and cooperative throughout examination. HEENT: No facial asymmetry,  EOMI, No sinus tenderness, TM's Clear, oropharynx  pink and moist.   Chest: Clear to auscultation bilaterally.  CVS: S1, S2, No murmurs, No S3.   Abd: Soft, Nontender.  MS: Decreased  ROM spine,adequate in  hips, shoulders and knees. Ext: No edema.   CNS: CN 2-12 intact, power tone and sensation normal throughout.   Skin: Intact, no visible lesions or rashes.  Psych: Good eye contact, normal affect.  Memory intact, not anxious or depressed appearing.    Impression & Recommendations:  Problem # 1:  ERECTILE DYSFUNCTION, NON-ORGANIC (ICD-302.72) Assessment Comment Only will check testoteron level per pt request, to check on testosteron supplementation  Problem #  2:  IMPAIRED GLUCOSE TOLERANCE (ICD-271.3) Assessment: Deteriorated hBA1C is 6.4 Pt advised to reduce carbohydrate intake, espescially sweets, and to start regular physical activity, at least 30 minutes 5 days weekly, to enable weight loss, and reduce the risk of becoming diabetic   Problem # 3:  OBESITY (ICD-278.00) Assessment: Deteriorated  Ht: 72 (03/01/2010)   Wt: 267 (03/01/2010)   BMI: 36.34 (03/01/2010)  Problem # 4:  HYPERTENSION  (ICD-401.9) Assessment: Improved  His updated medication list for this problem includes:    Amlodipine Besylate 5 Mg Tabs (Amlodipine besylate) .Marland Kitchen... 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   Problem # 7:  HYPERLIPIDEMIA (ICD-272.4) Assessment: Deteriorated  His updated medication list for this problem includes:    Lovastatin 40 Mg Tabs (Lovastatin) ..... Marland Kitchentake 1 tab by mouth at bedtime Low fat dietdiscussed and encouraged  Orders: T-Hepatic Function [phone]) T-Lipid Profile [phone])  Labs Reviewed: SGOT: 23 (02/28/2010)   SGPT: 18 (02/28/2010)  Lipid Goals: Chol Goal: 200 (06/02/2009)   HDL Goal: 40 (06/02/2009)   LDL Goal: 70 (06/02/2009)   TG Goal: 150 (06/02/2009)  Prior 10 Yr Risk Heart Disease: 22 % (06/02/2009)   HDL:46 (02/28/2010), 33 (09/06/2009)  LDL:162 (02/28/2010), See Comment mg/dL (96/29/5284)  XLKG:401 (02/28/2010), 230 (09/06/2009)  Trig:180 (02/28/2010), 469 (09/06/2009)  Complete Medication List: 1)  Flomax 0.4 Mg Caps (Tamsulosin hcl) .... Take 1 capsule by mouth once a day 2)  Amlodipine Besylate 5 Mg Tabs (Amlodipine besylate) .... Take 1 tablet by mouth once a day 3)  Asprin 81mg   .... One daily 4)  Calcium With Vit D 1200mg /100iu Gelcap  .... One daily 5)  Allopurinol 300 Mg Tabs (Allopurinol) .... Take 1 tablet by mouth once a day 6)  Vicodin 5-500 Mg  Tabs (Hydrocodone-acetaminophen) .... Take 1 tablet by mouth two times a day as needed for uncontrolled pain 7)  Colcrys 0.6 Mg Tabs (Colchicine) .... Take 1 tablet by mouth two times a day 8)  Ibuprofen 200 Mg Tabs (Ibuprofen) .... 4 tabs a day 9)  Lovastatin 40 Mg Tabs (Lovastatin) .... .take 1 tab by mouth at bedtime 10)  Vitamin D3 200iu  .... One daily 11)  Depo-testosterone 100 Mg/ml Oil (Testosterone cypionate) .... 50mg  im every 2 weeks  Other Orders: T- Hemoglobin A1C ([id number]) T-Uric Acid (Blood) ([id number]) T-TSH (38756-43329)  Patient Instructions: 1)  Please schedule a follow-up appointment in 4 months. 2)  Tobacco is very bad for your health and your loved ones! You Should stop smoking!. 3)  Stop Smoking Tips: Choose a Quit date. Cut down before the Quit date. decide what you will do as a substitute when you feel the urge to smoke(gum,toothpick,exercise). 4)  It is important that you exercise regularly at least 20 minutes 5 times a week. If you develop chest pain, have severe difficulty breathing, or feel very tired , stop exercising immediately and seek medical attention. 5)  You need to lose weight. Consider a  lower calorie diet and regular exercise.  6)  pls stop sweetened drinnks. 7)  pls start lovastatin for your cholesterol. 8)  New med requested for low testosterome 9)  PLS LOWER YOUR CARBS AND SWEETS Prescriptions: VICODIN 5-500 MG TABS (HYDROCODONE-ACETAMINOPHEN) Take 1 tablet by mouth two times a day as needed for uncontrolled pain  #30 x 3   Entered by:   Adella Hare LPN   Authorized by:   Syliva Overman MD   Signed by:   Adella Hare LPN on 78/29/5621   Method used:   Printed then faxed to ...       [facility] [phone]* (retail)       [address]       [place], [place]  [address]       Ph: [phone]       Fax: [phone]   RxID:   [id number] ANDRODERM 5 MG/24HR PT24 (TESTOSTERONE) apply one patch at bedtime to back,  upper srm , abdomen or thighs, area must be clean and dry  #30 x 3   Entered and Authorized by:   Syliva Overman MD   Signed by:   Syliva Overman MD on 03/01/2010   Method used:   Printed then faxed to ...       [facility]  [address] [phone]* (retail)       [address]       [place], [place]  [address]       Ph: [phone]       Fax: [phone]   RxID:   [id number] LOVASTATIN 40 MG TABS (LOVASTATIN) .Take 1 tab by mouth at bedtime  #30 x 4   Entered and Authorized by:   Syliva Overman MD   Signed by:   Syliva Overman MD on 03/01/2010   Method used:   Historical   RxID:   [id number]    Orders Added: 1)  Est. Patient Level IV [71062] 2)  T-Basic Metabolic Panel [69485-46270] 3)  T-Hepatic Function [80076-22960] 4)  T-Lipid Profile [80061-22930] 5)  T- Hemoglobin A1C [83036-23375] 6)  T-Uric Acid (Blood) [84550-23180] 7)  T-TSH [35009-38182] 8)  Medicare Electronic Prescription [X9371]

## 2010-05-19 NOTE — H&P (Signed)
NAME:  [name], [name]                         ACCOUNT NO.:  [id number]   MEDICAL RECORD NO.:  [id number]                   PATIENT TYPE:  INP   LOCATION:  NA                                   FACILITY:  [facility]   PHYSICIAN:  Hewitt Shorts, M.D.            DATE OF BIRTH:  01/22/1945   DATE OF ADMISSION:  02/15/2003  DATE OF DISCHARGE:                                HISTORY & PHYSICAL   HISTORY OF PRESENT ILLNESS:  The patient is a 65 year old, right-handed,  black male who was evaluated for low back and right lumbar radicular pain.  He has had increasing difficulties over the past several years with low back  pain extending into the right buttock.  He has numbness and tenderness in  the right buttock and posterior thigh.  He had an MRI done in 2002 and was  restudied last month.  He was treated two years ago with a course of  steroids.  He has adjusted his physical activity.  The pain decreased but  the senses have worsened further.  He is treated with another lengthy course  of steroids, and again his discomfort is somewhat lessened, but the pain  recurs when the steroid dose diminishes.   The patient was evaluated with x-rays and MRI scan.  The patient has  evidence of scoliosis in the lower thoracic and upper lumbar spine  associated with a small indentation in the lower lumbar spine.  The  sacralization of L5 and a grade 1 degenerative spondylolisthesis of L3 on L4  which is static to flexion and extension.  MRI reveals degenerative disease  with spondylosis at multiple levels, worse at the L3-4 and L4-5 levels.  L5  is sacralized.  There is significant arthropathy at L3-4 and L4-5.  There is  moderate multifactorial stenosis and lateral recess and foraminal stenosis  at L3-4 with moderate to marked spinal stenosis at L4-5, worse on the right  than the left with significant foraminal lateral recess stenosis, again  right worse than left.  The patient is admitted now for  decompression and  arthrodesis.   PAST MEDICAL HISTORY:  He does not describe any history of hypertension,  myocardial infarction, cancer, stroke, diabetes, peptic ulcer disease, lung  disease.  He does have hypercholesterolemia which is treated.   PAST SURGICAL HISTORY:  1. Removal of a cyst from his scrotum or testicle in 1998.  2. Cholecystectomy in 2002.   ALLERGIES:  Denies allergies to medications.   CURRENT MEDICATIONS:  Lipitor and Flomax.   FAMILY HISTORY:  His father has passed on.  His mother is age 93.   SOCIAL HISTORY:  The patient is married.  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  It is a fairly, heavy, strenuous job with  a lot of bending, reaching, lifting, pushing, pulling, etc.  He smokes a  pack a day.  He has  been smoking for nearly 45 years.  He does not drink  alcoholic beverages.  He denies history of substance abuse.   REVIEW OF SYSTEMS:  As described in history of present illness and past  medical history, but he is otherwise unremarkable.   PHYSICAL EXAMINATION:  GENERAL:  The patient is a well-developed, well-  nourished, black male, in no acute distress.  VITAL SIGNS:  Temperature 98.6, pulse 68, blood pressure 137/76, respiratory  rate 20, height 6 feet 2 inches, weight 247 pounds.  LUNGS:  Clear to auscultation.  He has symmetric respirations.  HEART:  Regular rate and rhythm.  S1 and S2.  No murmur.  ABDOMEN:  Soft, nontender.  Bowel sounds are present.  EXTREMITIES:  No clubbing, cyanosis or edema..  MUSCULOSKELETAL:  No tenderness on palpation over the lumbar spinous  process.  _________ musculature.  He is able to flex 90 degrees.  There is  some discomfort with that.  He is able to extend but, in fact, has more  discomfort with extension.  Straight leg raising is negative on the left,  although there is some tightness in the hamstrings.  Positive on the right  with some discomfort in the back, buttock and thigh, about  80-90 degrees.  NEUROLOGIC:  Shows 5/5 strength in the distal lower extremities.  Dorsiflexion, plantar flexion intact.  Sensation is intact to pinprick in  the distal lower extremities.  Reflexes are minimal at the quadriceps,  gastrocnemius symmetrical bilaterally.  He has a normal gait.   IMPRESSION:  Disabling low back and right lumbar radicular pain, numbness  and tingling.  The patient has scoliosis of the lower thoracic and upper  lumbar spine with some spondylosis and degenerative disk disease at L3-4 and  L4-5.  He has degenerative spondylolisthesis at L3 and L4 which is _________  at flexion and extension.  He has stenosis at the canal, L3-4 and L4-5, with  the worse encroachment being to the right side of L4-5.  L5 is partially  sacralized.   PLAN:  The patient is admitted for an L3 and L4 Gill procedure, bilateral L3-  4 and L4-5 posterior lumbar interbody fusion with implants and bone grafts  and bilateral L3-4 and L4-5 posterolateral arthrodesis with posterior  instrumentation and bone graft.  This alternatives to surgery, the nature of  the surgical procedure itself, overall recuperation and limitations  postoperatively were discussed with the patient.  ________ mobilization ________ lumbar facet.  Plan to use the Cell Saver  during such surgery and risks of surgery including the risk of infection,  bleeding, possibility of transfusion, risk of nerve root dysfunction, pain,  weakness, paresthesias, risk of dural tear, and possible need for further  surgery, the risk of failure, particularly in light of his smoking history,  and risk of myocardial infarction, stroke, pneumonia, death.  He understands  that his risks are increased for nonunion of his fusion as well as  anesthetic complications due to this smoking history and he was strongly  counseled to quit smoking to enhance the likelihood of a successful outcome. Understanding all this, he does wish to have the surgery  and is admitted for  such.                                                Hewitt Shorts, M.D.  RWN/MEDQ  D:  02/15/2003  T:  02/15/2003  Job:  [id number]

## 2010-05-19 NOTE — Consult Note (Signed)
[facility]  Patient:    NIKKI, RUSNAK Visit Number: [id number] MRN: [id number]          Service Type: MED Location: 3A A326 01 Attending Physician:  Elliot Cousin Dictated by:   Elpidio Anis, M.D. Admit Date:  10/15/2000                            Consultation Report  HISTORY OF PRESENT ILLNESS:  A 65 year old male admitted earlier today by Dr. Sherrie Mustache with complaint of severe abdominal pain with onset Saturday which was three days prior to admission.  He states that he had onset of fullness with pain in the mid epigastrium and mid abdominal region on Saturday evening after eating a donut and drinking some milk.  The pain became more severe on Sunday.  It was more intense yesterday.  He was seen in the emergency room last evening because of persistent severe pain and was sent home.  He developed fever and chills with continued severe discomfort.  The patient felt as if he was constipated since he had not had a bowel movement for at least four days.  He took multiple over-the-counter laxatives.  He finally had some results after taking some Epsom salts.  He also had some symptoms of severe dyspepsia and heartburn.  He had a black bowel movement last evening, but this is probably related to the Pepto-Bismol that he had been taking at home.  He is now admitted by Dr. Sherrie Mustache and is in moderately severe distress.  He had temperature elevation suggesting an acute inflammatory process.  He is admitted and will undergo very rapid workup this evening.  PAST MEDICAL HISTORY:  He has a history of diverticulosis and underwent colonoscopy in August 2001.  He had a small polypoid lesion of the sigmoid that was removed at that time.  There is a history of epididymitis, prostatitis, and he has cervical degenerative disk disease with bulging disks at C4-5 and C5-6.  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  Prior  to this admission, he was not on any chronic medication.  He has no known drug allergies.  PHYSICAL EXAMINATION:  GENERAL:  Patient is lying on his side in fetal position.  He is moderately severe distress.  VITAL SIGNS:  Blood pressure 114/58, pulse 83, respirations 29, temperature 101.9.  HEENT:  Unremarkable.  He is not jaundiced.  NECK:  Supple.  There is some tenderness posteriorly, but this is poorly localized.  There is no spasm of his trapezius muscles or sternocleidomastoid muscles.  There is no JVD or adenopathy.  CHEST:  Clear to auscultation.  No rales, rubs, rhonchi, or wheezes.  HEART:  Regular rate and rhythm without murmur, gallop, or rub.  ABDOMEN:  Tight, distended, with doughy character in the lower abdomen with exquisite tenderness in the epigastrium and right upper quadrant.  He has hyperactive bowel sounds diffusely in his abdomen.  EXTREMITIES:  No cyanosis, clubbing, or edema.  No joint deformity.  NEUROLOGIC:  Exam reveals no motor, sensory, or cerebellar deficit.  There are no sensory changes in his upper extremities.  LABORATORY DATA:  White count 13.8, hemoglobin 12.3, hematocrit 35.4.  Amylase and lipase normal.  Comprehensive panel is normal.  IMPRESSION:  Acute abdomen with acute inflammatory process.  History now suggests upper abdominal findings.  Must consider cholecystitis.  Must also consider  perforated viscus.  Possibility of a closed loop obstruction must also be considered though he has continued to have flatus.  RECOMMENDATION:  Proceed with CT scan as soon as possible, increase IV fluids, continue antibiotics.  If symptoms worsen, will need exploration. Dictated by:   Elpidio Anis, M.D. Attending Physician:  Elliot Cousin DD:  10/15/00 TD:  10/15/00 Job: [id number]

## 2010-05-19 NOTE — Discharge Summary (Signed)
[facility]  Patient:    [name], [name] Visit Number: [id number] MRN: [id number]          Service Type: MED Location: 3A A326 01 Attending Physician:  Elliot Cousin Dictated by:   Elliot Cousin, M.D. Admit Date:  10/15/2000 Discharge Date: 10/21/2000                             Discharge Summary  ADDENDUM  Please see discharge summary dated for admission October 15, 2000 and dated discharge October 21, 2000.  The patient was noted to be positive for Streptococcus viridans bacteremia during the discharge summary.  The patient returned to the office after discharge one week later.  He was subsequently treated with penicillin V 250 mg q.i.d. for 14 days.  During the office examination the patient was afebrile and without any symptoms whatsoever. Dictated by:   Elliot Cousin, M.D. Attending Physician:  Elliot Cousin DD:  12/13/00 TD:  12/13/00 Job: [id number]

## 2010-05-19 NOTE — Op Note (Signed)
[name], [name]               ACCOUNT NO.:  [id number]   MEDICAL RECORD NO.:  [id number]          PATIENT TYPE:  AMB   LOCATION:  DSC                          FACILITY:  [facility]   PHYSICIAN:  Robert A. Thurston Hole, M.D. DATE OF BIRTH:  06-28-1945   DATE OF PROCEDURE:  06/04/2005  DATE OF DISCHARGE:                                 OPERATIVE REPORT   PREOPERATIVE DIAGNOSES:  Left shoulder partial rotator cuff tear with  impingement and acromioclavicular joint arthropathy with labrum tear.   POSTOPERATIVE DIAGNOSES:  Left shoulder partial rotator cuff tear with  impingement and acromioclavicular joint arthropathy with labrum tear.   PROCEDURE:  1.  Left shoulder EUA followed by arthroscopic debridement, partial rotator      cuff tear and partial labrum tear.  2.  Left shoulder subacromial decompression.  3.  Left shoulder distal clavicle excision.   SURGEON:  Elana Alm. Thurston Hole, M.D.   ASSISTANT:  Julien Girt, P.A.   ANESTHESIA:  General.   OPERATIVE TIME:  45 minutes.   COMPLICATIONS:  None.   INDICATIONS FOR PROCEDURE:  Mr. Amparo is a 65 year old black male whose  had significant left shoulder pain for 4-6 months increasing in nature with  the exam. An MRI documenting a partial versus complete rotator cuff tear  with impingement and AC joint arthropathy. He has failed conservative care  and is now to undergo arthroscopy.   DESCRIPTION OF PROCEDURE:  Mr. Poer is brought to the operating room on  June 04, 2005 after an interscalene block was placed by anesthesia. He was  placed on the operating table in supine position. His left shoulder was  examined under anesthesia. He had full range of motion in his shoulder with  stable ligamentous exam. He was then placed in a beach chair position and  his shoulder and arm was prepped using sterile DuraPrep and draped using  sterile technique. Originally through a posterior arthroscopic portal, the  arthroscope with a pump  attached was placed into an anterior portal and  arthroscopic probe was placed. On initial inspection, the articular  cartilage in the glenohumeral joint was intact, anterior and superior labrum  partial tearing, 25-30% which was debrided. The inferior labrum and anterior  inferior glenohumeral ligament complex was intact. Biceps tendon anchor and  biceps tendon was intact, posterior labrum partial tearing 25% which was  debrided. The rotator cuff showed a partial tear, 30-40% in the  supraspinatus and infraspinatus and this was partially debrided but there  was still good cuff attached. The subscapularis and teres minor were intact.  The inferior capsular recess free of pathology. The subacromial space was  entered and a lateral arthroscopic portal was made. Moderately thickened  bursitis was resected. The rotator cuff was somewhat frayed and inflamed on  the bursal surface but no evidence of a tear. Impingement was noted and a  subacromial decompression was carried out removing 6-8 mm of the  undersurface of the anterolateral and anteromedial acromion and CA ligament  release carried out as well. [facility] joint showed significant spurring and  degenerative changes and the distal 5-6  mm of clavicle was resected with a 6  mm bur. After this was done, there was no further impingement noted and the  shoulder could be brought through a full range of motion with no impingement  on the rotator cuff. At this point, it was felt that all pathology had been  satisfactorily addressed. The instruments were removed. Portals closed with  3-0 nylon sutures, sterile dressings and a sling applied and the patient  awakened and taken to the recovery room in stable condition.   FOLLOWUP:  Mr. Venditto should be followed as an outpatient on Percocet for  pain with early physical therapy. Seen back in the office in a week for  sutures out and followup.      Robert A. Thurston Hole, M.D.  Electronically  Signed     RAW/MEDQ  D:  06/04/2005  T:  06/05/2005  Job:  [id number]

## 2010-05-19 NOTE — Op Note (Signed)
[facility]  Patient:    [name], [name] Visit Number: [id number] MRN: [id number]          Service Type: MED Location: 3A A326 01 Attending Physician:  Elliot Cousin Dictated by:   Elpidio Anis, M.D. Proc. Date: 10/16/00 Admit Date:  10/15/2000 Discharge Date: 10/21/2000                             Operative Report  PREOPERATIVE DIAGNOSIS:  Acute cholecystitis and cholelithiasis.  POSTOPERATIVE DIAGNOSIS:  Acute cholelithiasis and cholecystitis.  PROCEDURE:  Laparoscopy with open cholecystectomy.  SURGEON:  Elpidio Anis, M.D.  ANESTHESIA:  General endotracheal anesthesia.  DESCRIPTION OF PROCEDURE:  Under general endotracheal anesthesia, the patients abdomen was prepped and draped into a sterile field.  A supraumbilical incision was made and a Veress needle was inserted uneventfully.  Abdomen was insufflated with 3 L of CO2.  Using a Visiport guide, a 10-mm port was placed.  Laparoscope was placed and patient was positioned.  An acutely inflamed, edematous, thickened gallbladder with surrounding edema was encountered.  Standard upper abdominal incisions were made and the 10-mm and two 5-mm ports were placed.  With extreme difficulty, I was unable to visualize the structures of the gallbladder and surrounding tissue in order to do the procedure laparoscopically.  There was a large stone obstructing the neck of the gallbladder and pre-gangrenous changes existed. It was felt that an open procedure would be best.  The laparoscopy was aborted and a new set up was placed.  A standard subcostal incision was made. Gallbladder was grasped with a Kelly clamp.  The serosa was scored, after which the major portion of the dissection was done with blunt dissection using finger dissection.  Dissection was continued down to the cystic duct.  The cystic duct was followed closely to its junction with the gallbladder.  It was grasped and then clamped with a Kelly  clamp and divided.  The duct was tied off with two ligatures of 2-0 silk.  Copious irrigation was carried out.  JP drain was placed and brought out through the most lateral incision.  Further irrigation was carried out.  Hemostasis was felt to be adequate.  The incision was closed using running 0 Prolene on the fascial layers, 3-0 Biosyn in the subcutaneous tissue and staples on the skin.  Dressing was placed.  He was awakened from anesthesia and transferred to a bed and taken to the post-anesthetic care unit for monitoring. Dictated by:   Elpidio Anis, M.D. Attending Physician:  Elliot Cousin DD:  11/21/00 TD:  11/22/00 Job: [id number]

## 2010-05-19 NOTE — Discharge Summary (Signed)
NAME:  [name], [name]                         ACCOUNT NO.:  [id number]   MEDICAL RECORD NO.:  [id number]                   PATIENT TYPE:  INP   LOCATION:  3006                                 FACILITY:  [facility]   PHYSICIAN:  Hewitt Shorts, M.D.            DATE OF BIRTH:  09/08/1945   DATE OF ADMISSION:  02/15/2003  DATE OF DISCHARGE:  02/19/2003                                 DISCHARGE SUMMARY   HISTORY OF PRESENT ILLNESS:  The patient is a 65 year old man who presented  with low back and right lumbar radicular pain who is found to have extensive  degenerative changes in the low back with a grade 1 degenerative  spondylolisthesis at L3 on 4, advanced spondylosis, degenerative disk  disease at L3-4 and L4-5, facet arthropathy of L3-4 and L4-5, moderate  multifactorial stenosis of the lateral recess and foraminal stenosis L3-4  and L4-5.  The patient is admitted for decompression and arthrodesis.   PHYSICAL EXAMINATION:  GENERAL:  Unremarkable.  VITAL SIGNS:  His height is 6 feet 2 inches.  His weight is 247 pounds.  NEUROLOGIC:  Showed 5/5 strength.  Intact sensation.   HOSPITAL COURSE:  The patient was admitted, underwent L3 and L4 Gill  procedure, and L3-4 and L4-5 posterior lumbar interbody fusion with Globus  Peak interbody cages and Vetox with bone marrow aspirate, and L3-4 and  L4-5 posterior lateral arthrodesis with 90D posterior instrumentation, and a  Vetox bone marrow aspirate, and locally harvested morselized autograft.   The patient did well following surgery.  However, his discomfort increased  so he did have some difficulties with postoperative fever which went up as  high as 102.7.  His urinalysis was checked which showed no pyuria.  Urine  culture and blood cultures have been obtained, and have shown no evidence of  growth.  White blood cell counts were checked on the second postoperative  day and was 8.1.  Repeat on the day of discharge, the white blood cell  count  was 5.6.  The patient has since defervesced.  However, although his wound  looked good initially, and in fact the incision itself is healing well, he  did develop a subcutaneous hematoma on the second postoperative day.  This  has been monitored.  He has had a small amount of bloody drainage, no  purulence, and the wound continues to appear well-healed.   The patient has been given detailed instructions regarding wound care, as  well as activities following discharge.  He was give prescriptions for  Percocet and Flexeril which have been filled, and he is to use Aleve two  tablets b.i.d.  He is to return in three weeks and have AP and lateral  lumbar spine x-rays performed.   DISCHARGE DIAGNOSES:  1. Lumbar stenosis.  2. Lumbar spondylosis.  3. Lumbar degenerative disk disease.  4. Low back pain.  5. Lumbar radiculopathy.  Hewitt Shorts, M.D.    RWN/MEDQ  D:  02/19/2003  T:  02/20/2003  Job:  [id number]

## 2010-05-19 NOTE — Discharge Summary (Signed)
[facility]  Patient:    Edward, Velasquez Visit Number: [id number] MRN: [id number]          Service Type: MED Location: 3A A326 01 Attending Physician:  [name] Dictated by:   [name], M.D. Admit Date:  10/15/2000 Discharge Date: 10/21/2000                             Discharge Summary  DISCHARGE DIAGNOSES: 1. Acute gangrenous cholecystitis, status post open cholecystectomy. 2. Streptococcus viridans bacteremia. 3. Microcytic anemia. 4. Acute delirium secondary to Restoril.  SECONDARY DISCHARGE DIAGNOSES: 1. Diverticulosis, status post sigmoid polypectomy in August of 2001. 2. History of mild hyperlipidemia. 3. Tobacco abuse. 4. History of prostatitis. 5. History of epididymitis. 6. 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 7. 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  DISCHARGE MEDICATIONS: 1. Dilaudid 8 mg q.6h. p.r.n. pain. 2. Reglan 10 mg q.a.c. and q.h.s. 3. Tylenol 650 mg q.4h. p.r.n. for fever.  DISCHARGE DISPOSITION:  [name] was discharged to home on October 21, 2000, in stable condition.  He currently has JP drains in and will be reassessed in a few days by [name], M.D., for surgery follow-up.  CONSULTATIONS:  [name], M.D.  PROCEDURES PERFORMED: 1. CT scan of the abdomen and pelvis with contrast.  The results revealed a    2.4 cm in diameter gallstone in the gallbladder neck with gallbladder wall    thickening and mild pericholecystic fluid accumulation.  The CT scan of the    abdomen revealed no mass, adenopathy, or free fluid. 2. Attempted laparoscopic cholecystectomy converted to open cholecystectomy on    October 16, 2000.  HISTORY OF PRESENT ILLNESS:  [name] is a 65 year old African-American man with a history of diverticulosis, who presented to the office on October 15, 2000, with a four-day history of initial constipation and subsequent  lower crampy abdominal pain.  The patient reported that he had not had a bowel movement in four days, which was unusual for him.  He stated that his abdomen became distended and painful initially at the left lower quadrant, but a day or two later it began to radiate across his lower abdomen to the right.  He took multiple over-the-counter laxative medications to relieve his symptoms. He finally had a large black bowel movement approximately two nights prior to presenting to the office.  The patient states that one of the over-the-counter laxative medicines was Pepto-Bismol.  He denied any gross red blood in his stools.  The patient eventually went to the emergency room because of the persistent pain.  The patient was given mild analgesics and sent home.  The patient developed subjective fever, chills, and worsening abdominal pain over the 24 hours prior to presenting to the office.  The patient reported that his oral intake had been poor over the past four days, but denied nausea, vomiting, and bloody stools.  He took some Fleets enemas the night before presenting this large bowel movement.  For additional details, please see the written history and physical.  HOSPITAL COURSE: #1 - SEVERE ABDOMINAL PAIN AND FEVER SECONDARY TO ACUTE GANGRENOUS CHOLECYSTITIS:  On initial examination, the patients temperature was 102.8 degrees in the office.  He was in moderate to severe discomfort because of his abdominal pain.  He was tender diffusely over his abdomen with localization from the left lower quadrant to  the right lower quadrant to the right upper quadrant.  He had scant stool in his rectal vault which was trace guaiac positive.  The patient was admitted for aggressive management of his abdominal pain and fever.  He was managed initially with aggressive IV fluids, volume repletion, empiric antibiotic coverage with Cefotan and Cleocin, empiric stress ulcer prophylaxis with Pepcid IV q.12h.,  Phenergan IV p.r.n. for nausea, and morphine IV p.r.n. for pain.  The patient was kept NPO secondary to his apparent peritonitis.  Stat labs and CT scan of the abdomen and pelvis were ordered as soon as possible.  A surgical consult with [name], M.D., was also obtained immediately.  The patients initial laboratories revealed a WBC of 13.8, a hemoglobin of 12.3, a hematocrit of 35.4, an MCV of 78.7, and platelets of 164.  Sodium 136, potassium 3.5, chloride 104, CO2 29, glucose 117, BUN 12, creatinine 1.3. calcium 8.9, total protein 6.9, albumin 3.6, AST 23, ALT 25, alkaline phosphatase 83, total bilirubin 1.3, amylase 49, lipase 23, CEA 0.8. PSA 0.77.  Urinalysis with urine protein of 30, urine wbcs 3-6, and urine bacteria rare.  The CT scan of the abdomen and pelvis revealed that the patient had findings consistent with acute cholecystitis with a 2.4 cm diameter gallstone in the gallbladder neck with surrounding inflammation and thickening.  Given these findings, [name], M.D., scheduled a laparoscopic cholecystectomy. However, during the attempt of the laparoscopic cholecystectomy, [name] found that the patient had a grossly gangrenous gallbladder, which had to be removed by the open method.  After the operation, the patients pain was treated with morphine and Toradol. Levaquin was added as additional antibiotic coverage.  The patient recovered very well over the days following the surgery.  He slowly began eating clear liquids, which were then progressed to full liquids and then finally solid foods.  The patient had a PCA morphine pump which was eventually discontinued several days prior to hospital discharge.  The patients liver function tests mildly increased after surgery with an AST of 52 and an ALT of 50.  The remainder of the liver function tests remained normal.  At discharge, the patient was in minimal pain.  He was eating and voiding well.  He has JP drains  still in.  He will have a surgery assessment by [name], M.D., in approximately one week.   #2 - STREPTOCOCCUS VIRIDANS BACTEREMIA:  During the hospital course, blood cultures were ordered secondary to the patients fever and elevated white blood cell count.  The blood cultures were apparently pending at the hospital discharge.  However, during this dictation, it was found that the blood cultures were positive x 2 for Streptococcus viridans.  This information will be followed up on and the patient will be treated accordingly if needed.  The patient did have a mild fever prior to hospital discharge, however, he was felt to be nonseptic and stable before discharge.  Again, the results of the positive blood cultures will handled accordingly.  #3 - MICROCYTIC ANEMIA:  On initial exam, the patients stool was trace guaiac positive.  His initial hemoglobin was 12.3, his initial hematocrit was 35.4, and his initial MCV was 78.7.  However, during the hospital course, the patients hemoglobin fell to a nadir of 10.9 and the hematocrit fell to a nadir of 31.4.  His stools were tested x 3 for occult/microscopic blood loss. The stools were negative via the Hemoccult cards.  Iron studies were ordered and the patient was  found to have a reticulocyte count of 31.7 (absolute), a total iron of less than 10, a B12 of 228, a folate of 7.4, and a total ferritin of 430.  The patient will continue to have his anemia evaluated and treated if needed.  He will have a repeat CBC in approximately one week at his hospital follow-up.  If he is still anemic, he will be treated with supplemental iron.  Part of the patients drop in hemoglobin was postoperatively.  #4 - ACUTE DELIRIUM SECONDARY TO RESTORIL:  Approximately 24 hours after the operation, the patient complained of difficulty sleeping.  Restoril was prescribed as needed for sleep.  The patient was given Restoril the night before, however, he had an acute  episode of confusion which lasted for several hours.  Within 12-18 hours of the Restoril being given, the patients cognition cleared completely.  He had no further episodes of confusion during the remainder of the hospital course. Dictated by:   [name], M.D. Attending Physician:  [name] DD:  10/28/00 TD:  10/29/00 Job: [id number]

## 2010-05-19 NOTE — Op Note (Signed)
NAME:  [name], [name]                         ACCOUNT NO.:  [id number]   MEDICAL RECORD NO.:  [id number]                   PATIENT TYPE:  INP   LOCATION:  2899                                 FACILITY:  [facility]   PHYSICIAN:  Hewitt Shorts, M.D.            DATE OF BIRTH:  [birth year]   DATE OF PROCEDURE:  02/15/2003  DATE OF DISCHARGE:                                 OPERATIVE REPORT   PREOPERATIVE DIAGNOSES:  Lumbar stenosis, degenerative spondylolisthesis (L3-  4), lumbar spondylosis, lumbar degenerative disk disease, lumbar  radiculopathy.   POSTOPERATIVE DIAGNOSES:  Lumbar stenosis, degenerative spondylolisthesis  (L3-4), lumbar spondylosis, lumbar degenerative disk disease, lumbar  radiculopathy.   PROCEDURE:  L3-L4 Gill procedure. L3-4 and L4-5 posterior lumbar interbody  fusion with globus peak interbody cages and Vitoss with bone narrow  aspirate.  L3-4, L4-5 posterolateral arthrodesis with non-IDET posterior  instrumentation, Vitoss bone marrow aspirate, locally harvested autograft  (morcellized).   SURGEON:  Hewitt Shorts, M.D.   ASSISTANT:  Clydene Fake, M.D.   ANESTHESIA:  General endotracheal.   INDICATIONS FOR PROCEDURE:  The patient is a 65 year old male who presented  with a right lumbar radiculopathy, as well as low back pain.  He was found  to have significant lumbar stenosis at L3-4 and L4-5 with degenerative  spondylolisthesis L3-4.  There was static flexion and extension.  There was  degenerative scoliosis and advanced facet arthropathy and lateral recess  stenosis.  Decision was made to proceed with decompression and arthrodesis.   DESCRIPTION OF PROCEDURE:  The patient was brought to the operating room,  placed under general endotracheal anesthesia.  Patient was turned to a prone  position, lumbar region was prepped with Betadine soap and solution, draped  in a sterile fashion.  The midline was infiltrated with local anesthetic  with  epinephrine.  An x-ray was taken.  The L3-L5 level was identified, and  the midline incision was made, carried down through subcutaneous tissue.  Bipolar cautery and electrocautery was used to maintain hemostasis.   Dissection was carried down to the lumbar fascia which was incised  bilaterally and paraspinal muscles were dissected from the spinous processes  and lamina in a subperiosteal fashion.  Another localizing x-ray was taken.  The L3-4 and L4-5 lamina and spinous processes, as well as interlaminar  spaces were identified. Dissection was then carried out laterally over the  arthropathic facet complexes bilaterally at L3-4 and L4-5. We identified the  transverse processes of L3, L4 and L5 bilaterally.  The facet complexes were  hypertrophic and we removed much of the arthritic overgrowth with Leksell  rongeur and black-max drill.   We then proceded withL3 and L4 Gill procedures using the black-max drill and  Kerrison punches and double action rongeurs.  Ligamentum flavum was markedly  thickened in both the L4-5 and L3-4 levels, and this was carefully removed.  We were able to decompress the spinal  canal. Decompression was carried  laterally and the inferior facets of L3 and L4 bilaterally were removed. We  were able to identify the exiting nerve roots, including the L3-L4 and L5  nerve roots bilaterally.  There was particular encroachment on the right  side from the L4 nerve root.  This was carefully decompressed.  Using loupe  magnification, diskectomy was performed bilaterally at L3-4 and L4-5.  Overlying epidural veins were coagulated.  The annulus was incised,  spondylitic overgrowth in the posterior aspect of each vertebral body was  carefully removed, and the disk space was cleared of degenerated disk  material.   The cauterized endplates of the corresponding vertebrae were carefully  removed, and the endplates prepared for arthrodesis.  We used a variety of curets to  prepare the endplates, and used trial spacers  to determine a proper size. We selected 11 mm implants at each level.  We  then probed the pedicle of the right L4 and aspirated bone marrow aspirate,  which was injected over 20 cc of Vitoss foam, which was saturated with the  bone marrow aspirate.  We then packed the 11 mm x 10 mm globus peak implants  with the Vitoss with bone marrow aspirate.  We removed the trial spacers and  placed the permanent implants in place and counter-sunk them.  We then took  additional Vitoss bone marrow aspirate and packed them densely around the  cages at both levels.   We then brought in the C-arm fluoroscopy units and proceded with placement  of pedicle screws bilaterally at L3, L4 and L5.  Each pedicle was probed,  examined with a ball probe, tapped with a 5.25 tap, again examined with a  ball probe and then a 6.75 screw was placed. We placed 45 mm screws at L3  and L5, and 40 mm screws at L4.  There was some disruption of the left L4  pedicle, but the threading of the screw did not come through the pedicle and  the nerve root was undisturbed.  We selected 60 mm rods, which were gently  lordosed with a rod bender.  Prior placing the rods though, the transverse  processes were decorticated and we packed in Vitoss with bone marrow  aspirate over the transverse processes and inter-transverse spaces.   Then the bone from the [facility] procedure which had been cleaned of soft tissue  and  morcellized was packed over the Vitoss. We then placed the rods  bilaterally, placing locking capsulotomy over each screw head. Rods were  secured and then final tightening of all 6 locking caps was performed.  We  then selected a medium cross-connector and secured it to the rods between  the L4 and L5 screws, and then tightened. We then examined the epidural  space to ensure that none of the bone graft had fallen into the space and  that the thecal sac and nerve roots remained  well decompressed.  We then proceded with closure.  The paraspinal muscles were approximated  with interrupted undyed #1 Vicryl sutures, the deep fascia was closed with  interrupted undyed #1 Vicryl sutures, the deep subcutaneous layer was closed  with interrupted undyed #1 Vicryl sutures and the subcutaneous and  subcuticular layer closed with interrupted inverted undyed 2-0 Vicryl, and  skin was approximated with Dermabond. The patient tolerated the procedure  well. Estimated blood loss was 750 cc. We did give back to the patient 500  cc of Cell Saver blood.  Sponge and needle  counts were correct.   Following surgery, the patient was turned back to the supine position,  reversed from anesthetic, extubated and transferred to the recovery room for  further care.                                               Hewitt Shorts, M.D.    RWN/MEDQ  D:  02/15/2003  T:  02/15/2003  Job:  [id number]

## 2010-06-30 ENCOUNTER — Encounter: Payer: Self-pay | Admitting: Family Medicine

## 2010-07-03 ENCOUNTER — Encounter: Payer: Self-pay | Admitting: Family Medicine

## 2010-07-03 ENCOUNTER — Ambulatory Visit (INDEPENDENT_AMBULATORY_CARE_PROVIDER_SITE_OTHER): Payer: Medicare Other | Admitting: Family Medicine

## 2010-07-03 VITALS — BP 150/90 | HR 90 | Resp 16 | Ht 73.5 in | Wt 252.1 lb

## 2010-07-03 DIAGNOSIS — N401 Enlarged prostate with lower urinary tract symptoms: Secondary | ICD-10-CM

## 2010-07-03 DIAGNOSIS — E559 Vitamin D deficiency, unspecified: Secondary | ICD-10-CM

## 2010-07-03 DIAGNOSIS — R7301 Impaired fasting glucose: Secondary | ICD-10-CM

## 2010-07-03 DIAGNOSIS — E669 Obesity, unspecified: Secondary | ICD-10-CM

## 2010-07-03 DIAGNOSIS — M109 Gout, unspecified: Secondary | ICD-10-CM

## 2010-07-03 DIAGNOSIS — I10 Essential (primary) hypertension: Secondary | ICD-10-CM

## 2010-07-03 DIAGNOSIS — F17200 Nicotine dependence, unspecified, uncomplicated: Secondary | ICD-10-CM

## 2010-07-03 DIAGNOSIS — E785 Hyperlipidemia, unspecified: Secondary | ICD-10-CM

## 2010-07-03 DIAGNOSIS — M479 Spondylosis, unspecified: Secondary | ICD-10-CM

## 2010-07-03 DIAGNOSIS — F4310 Post-traumatic stress disorder, unspecified: Secondary | ICD-10-CM

## 2010-07-03 DIAGNOSIS — E739 Lactose intolerance, unspecified: Secondary | ICD-10-CM

## 2010-07-03 MED ORDER — AMLODIPINE BESYLATE 5 MG PO TABS: 5.0000 mg | ORAL_TABLET | Freq: Every day | ORAL | Status: DC

## 2010-07-03 MED ORDER — PRAVASTATIN SODIUM 80 MG PO TABS: 80.0000 mg | ORAL_TABLET | Freq: Every evening | ORAL | Status: DC

## 2010-07-03 MED ORDER — IBUPROFEN 800 MG PO TABS: 800.0000 mg | ORAL_TABLET | Freq: Three times a day (TID) | ORAL | Status: AC | PRN

## 2010-07-03 NOTE — Assessment & Plan Note (Signed)
rept lab due. Also pt congratulated on weight loss, encouraged to continue healthy lifestyle changes

## 2010-07-03 NOTE — Patient Instructions (Addendum)
F/u  In 3 months.  pLS take medication, as prescribed for high blood pressure and cholesterol, also for arthritis  Labs today fasting lipid, hepatic , chem 7, vit D and HBA1c and uric acid  congrats on 65 when it comes   Please think about quitting smoking.  This is very important for your health.  Consider setting a quit date, then cutting back or switching brands to prepare to stop.  Also think of the money you will save every day by not smoking.  Quick Tips to Quit Smoking: Fix a date i.e. keep a date in mind from when you would not touch a tobacco product to smoke  Keep yourself busy and block your mind with work loads or reading books or watching movies in malls where smoking is not allowed  Vanish off the things which reminds you about smoking for example match box, or your favorite lighter, or the pipe you used for smoking, or your favorite jeans and shirt with which you used to enjoy smoking, or the club where you used to do smoking  Try to avoid certain people places and incidences where and with whom smoking is a common factor to add on  Praise yourself with some token gifts from the money you saved by stopping smoking  Anti Smoking teams are there to help you. Join their programs  Anti-smoking Gums are there in many medical shops. Try them to quit smoking   Side-effects of Smoking: Disease caused by smoking cigarettes are emphysema, bronchitis, heart failures  Premature death  Cancer is the major side effect of smoking  Heart attacks and strokes are the quick effects of smoking causing sudden death  Some smokers lives end up with limbs amputated  Breathing problem or fast breathing is another side effect of smoking  Due to more intakes of smokes, carbon mono-oxide goes into your brain and other muscles of the body which leads to swelling of the veins and blockage to the air passage to lungs  Carbon monoxide blocks blood vessels which leads to blockage in the flow of blood to  different major body organs like heart lungs and thus leads to attacks and deaths  During pregnancy smoking is very harmful and leads to premature birth of the infant, spontaneous abortions, low weight of the infant during birth  Fat depositions to narrow and blocked blood vessels causing heart attacks  In many cases cigarette smoking caused infertility in men

## 2010-07-03 NOTE — Assessment & Plan Note (Signed)
Deteriorated, counseled to quit 

## 2010-07-03 NOTE — Assessment & Plan Note (Signed)
Continues to have nightmares and flashbacks, needs to continue therapy

## 2010-07-03 NOTE — Assessment & Plan Note (Signed)
Deteriorated. Patient re-educated about  the importance of commitment to a  minimum of 150 minutes of exercise per week. The importance of healthy food choices with portion control discussed. Encouraged to start a food diary, count calories and to consider  joining a support group. Sample diet sheets offered. Goals set by the patient for the next several months.    

## 2010-07-03 NOTE — Assessment & Plan Note (Signed)
Uncontrol due to medical non compliance , importance of same stressed

## 2010-07-03 NOTE — Progress Notes (Signed)
  Subjective:    Patient ID: [name], male    DOB: [birth year], 65 y.o.   MRN: [id number]  HPI C/o  Uncontrolled neck pain radiating  To left upper extremity, this is not new, and the pt has been to neurosurgeon  In the past , and he has no clear indication for surgery at this time. 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 He has changed his diet and is very active and has had excellent weight loss He is currently smoking 1 pPD of cigarretes and wants to quit though does not feel capable  At this time  Review of Systems Denies recent fever or chills. Denies sinus pressure, nasal congestion, ear pain or sore throat. Denies chest congestion, productive cough or wheezing. Denies chest pains, palpitations, paroxysmal nocturnal dyspnea, orthopnea and leg swelling Denies abdominal pain, nausea, vomiting,diarrhea or constipation.  Denies rectal bleeding or change in bowel movement. Denies dysuria, frequency, hesitancy or incontinence.  Denies headaches, seizure, numbness, or tingling. Denies uncontrolled  depression, anxiety or insomnia.[facility] Denies skin break down or rash.        Objective:   Physical Exam Patient alert and oriented and in no Cardiopulmonary distress.  HEENT: No facial asymmetry, EOMI, no sinus tenderness, TM's clear, Oropharynx pink and moist.  Neck supple no adenopathy.  Chest: Clear to auscultation bilaterally.Decreased air entry throughout  CVS: S1, S2 no murmurs, no S3.  ABD: Soft non tender. Bowel sounds normal.  Ext: No edema  OZ:HYQMVHQIO ROM spine,adequate in  shoulders, hips and knees.  Skin: Intact, no ulcerations or rash noted.  Psych: Good eye contact, normal affect. Memory intact not anxious or depressed appearing.  CNS: CN 2-12 intact, power, tone and sensation  normal throughout.        Assessment & Plan:

## 2010-07-03 NOTE — Assessment & Plan Note (Signed)
Low fat diet encouraged, pt also needs to take meds prescribed, he currently is non compliant, importance of same discussed

## 2010-07-03 NOTE — Assessment & Plan Note (Signed)
Deteriorated, requests ibuprofen prescription be resumed once daily

## 2010-10-09 ENCOUNTER — Ambulatory Visit: Payer: Medicare Other | Admitting: Family Medicine

## 2010-10-16 ENCOUNTER — Encounter: Payer: Self-pay | Admitting: Family Medicine

## 2010-10-20 ENCOUNTER — Encounter: Payer: Self-pay | Admitting: Family Medicine

## 2010-10-20 ENCOUNTER — Ambulatory Visit (INDEPENDENT_AMBULATORY_CARE_PROVIDER_SITE_OTHER): Payer: Medicare Other | Admitting: Family Medicine

## 2010-10-20 VITALS — BP 130/80 | HR 67 | Resp 16 | Ht 72.0 in | Wt 258.0 lb

## 2010-10-20 DIAGNOSIS — I10 Essential (primary) hypertension: Secondary | ICD-10-CM

## 2010-10-20 DIAGNOSIS — E669 Obesity, unspecified: Secondary | ICD-10-CM

## 2010-10-20 DIAGNOSIS — Z23 Encounter for immunization: Secondary | ICD-10-CM

## 2010-10-20 DIAGNOSIS — F329 Major depressive disorder, single episode, unspecified: Secondary | ICD-10-CM

## 2010-10-20 DIAGNOSIS — N401 Enlarged prostate with lower urinary tract symptoms: Secondary | ICD-10-CM

## 2010-10-20 DIAGNOSIS — IMO0002 Reserved for concepts with insufficient information to code with codable children: Secondary | ICD-10-CM

## 2010-10-20 DIAGNOSIS — F3289 Other specified depressive episodes: Secondary | ICD-10-CM

## 2010-10-20 DIAGNOSIS — M479 Spondylosis, unspecified: Secondary | ICD-10-CM

## 2010-10-20 DIAGNOSIS — E785 Hyperlipidemia, unspecified: Secondary | ICD-10-CM

## 2010-10-20 DIAGNOSIS — R7301 Impaired fasting glucose: Secondary | ICD-10-CM

## 2010-10-20 LAB — BASIC METABOLIC PANEL
Chloride: 107 mEq/L (ref 96–112)
Potassium: 4.5 mEq/L (ref 3.5–5.3)
Sodium: 138 mEq/L (ref 135–145)

## 2010-10-20 LAB — LIPID PANEL
Cholesterol: 232 mg/dL — ABNORMAL HIGH (ref 0–200)
LDL Cholesterol: 130 mg/dL — ABNORMAL HIGH (ref 0–99)
Total CHOL/HDL Ratio: 5.3 Ratio
Triglycerides: 288 mg/dL — ABNORMAL HIGH (ref <150)
VLDL: 58 mg/dL — ABNORMAL HIGH (ref 0–40)

## 2010-10-20 LAB — PSA: PSA: 1.01 ng/mL (ref <=4.00)

## 2010-10-20 LAB — HEPATIC FUNCTION PANEL
ALT: 20 U/L (ref 0–53)
Bilirubin, Direct: 0.1 mg/dL (ref 0.0–0.3)
Total Protein: 7.9 g/dL (ref 6.0–8.3)

## 2010-10-20 LAB — HEMOGLOBIN A1C
Hgb A1c MFr Bld: 5.8 % — ABNORMAL HIGH (ref <5.7)
Mean Plasma Glucose: 120 mg/dL — ABNORMAL HIGH (ref <117)

## 2010-10-20 MED ORDER — IBUPROFEN 800 MG PO TABS: 800.0000 mg | ORAL_TABLET | Freq: Every day | ORAL | Status: AC

## 2010-10-20 MED ORDER — TAMSULOSIN HCL 0.4 MG PO CAPS: 0.4000 mg | ORAL_CAPSULE | ORAL | Status: DC

## 2010-10-20 NOTE — Patient Instructions (Addendum)
CPE  in 5 months.  Congrats blood pressure  in excellent.  Blood sugars have improved greatly, keep it up  Congrats on quitting smoking, you look much better   Med is sent in as requested  Fasting lipid, HBa1C in 5 month  Please eat from your garden, and do not eat fried foods and red meat, your cholesterol is still too high

## 2010-10-21 NOTE — Assessment & Plan Note (Signed)
Improved, pt less stressed financially

## 2010-10-21 NOTE — Assessment & Plan Note (Signed)
Continue flomax, current PSA is normal, needs DRE

## 2010-10-21 NOTE — Assessment & Plan Note (Signed)
Controlled, no change in medication  

## 2010-10-21 NOTE — Progress Notes (Signed)
  Subjective:    Patient ID: [name], male    DOB: 1945/10/21, 65 y.o.   MRN: [id number]  HPI The PT is here for follow up and re-evaluation of chronic medical conditions, medication management and review of any available recent lab and radiology data.  The PT denies any adverse reactions to current medications since the last visit.  Pt remains nicotine free, he has changed his eating habits with excellent results as far as his blood sugar is concerned.he reports being much less stressed, his disability income from the [place] has come through and this has removed a lot of his stress C/o poor urinary stream without flomax, notes that when this is good his sexual performance is better. Still having flashbacks from PTSD    Review of Systems See HPI Denies recent fever or chills. Denies sinus pressure, nasal congestion, ear pain or sore throat. Denies chest congestion, productive cough or wheezing. Denies chest pains, palpitations and leg swelling Denies abdominal pain, nausea, vomiting,diarrhea or constipation.   Denies dysuria or  frequency,or incontinence c/o back pain, relies on once daily ibuprofen. Denies headaches, seizures, numbness, or tingling. Denies uncontrolled  depression, anxiety or insomnia.Marked improvement with improved income. Denies skin break down or rash.        Objective:   Physical Exam Patient alert and oriented and in no cardiopulmonary distress.  HEENT: No facial asymmetry, EOMI, no sinus tenderness,  oropharynx pink and moist.  Neck supple no adenopathy.  Chest: Clear to auscultation bilaterally.  CVS: S1, S2 no murmurs, no S3.  ABD: Soft non tender. Bowel sounds normal.  Ext: No edema  MS: Adequate though reduced ROM spine, shoulders, hips and knees.  Skin: Intact, no ulcerations or rash noted.  Psych: Good eye contact, normal affect. Memory intact not anxious or depressed appearing.  CNS: CN 2-12 intact, power, tone and sensation normal  throughout.        Assessment & Plan:

## 2010-10-21 NOTE — Assessment & Plan Note (Signed)
Deteriorated. Patient re-educated about  the importance of commitment to a  minimum of 150 minutes of exercise per week. The importance of healthy food choices with portion control discussed. Encouraged to start a food diary, count calories and to consider  joining a support group. Sample diet sheets offered. Goals set by the patient for the next several months.    

## 2010-10-21 NOTE — Assessment & Plan Note (Signed)
Unchanged, maintained on daily ibuprofen

## 2010-10-21 NOTE — Assessment & Plan Note (Signed)
Improved pt non compliant with meds, has decided on dietary change only at [facility] time

## 2011-03-20 ENCOUNTER — Encounter: Payer: Medicare Other | Admitting: Family Medicine

## 2011-11-15 ENCOUNTER — Ambulatory Visit (INDEPENDENT_AMBULATORY_CARE_PROVIDER_SITE_OTHER): Payer: Medicare Other | Admitting: Family Medicine

## 2011-11-15 ENCOUNTER — Encounter: Payer: Self-pay | Admitting: Family Medicine

## 2011-11-15 VITALS — BP 160/80 | HR 96 | Resp 18 | Ht 72.0 in | Wt 271.1 lb

## 2011-11-15 DIAGNOSIS — M129 Arthropathy, unspecified: Secondary | ICD-10-CM

## 2011-11-15 DIAGNOSIS — E669 Obesity, unspecified: Secondary | ICD-10-CM

## 2011-11-15 DIAGNOSIS — I10 Essential (primary) hypertension: Secondary | ICD-10-CM

## 2011-11-15 DIAGNOSIS — F4310 Post-traumatic stress disorder, unspecified: Secondary | ICD-10-CM

## 2011-11-15 DIAGNOSIS — IMO0002 Reserved for concepts with insufficient information to code with codable children: Secondary | ICD-10-CM

## 2011-11-15 DIAGNOSIS — E785 Hyperlipidemia, unspecified: Secondary | ICD-10-CM

## 2011-11-15 MED ORDER — HYDROCODONE-ACETAMINOPHEN 7.5-750 MG PO TABS: ORAL_TABLET | ORAL | Status: AC

## 2011-11-15 MED ORDER — PREDNISONE (PAK) 5 MG PO TABS: 5.0000 mg | ORAL_TABLET | ORAL | Status: AC

## 2011-11-15 MED ORDER — PREDNISONE (PAK) 5 MG PO TABS: 5.0000 mg | ORAL_TABLET | ORAL | Status: DC

## 2011-11-15 MED ORDER — CLONIDINE HCL 0.2 MG PO TABS: ORAL_TABLET | ORAL | Status: DC

## 2011-11-15 MED ORDER — IBUPROFEN 800 MG PO TABS: 800.0000 mg | ORAL_TABLET | Freq: Three times a day (TID) | ORAL | Status: AC | PRN

## 2011-11-15 NOTE — Patient Instructions (Addendum)
F/u  In 4 month or as needed.   [facility] with [facility]  Toradol 60mg  and depo medrol 80mg  Im in office today for arthritis pain  Ibuprofen, prednisone , vicodin are sent to the pharmacy for pain  Blood pressure is high. Clonidine is prescribed for bedtime use for blood pressure

## 2011-11-15 NOTE — Progress Notes (Signed)
  Subjective:    Patient ID: [name], male    DOB: 1945/01/12, 66 y.o.   MRN: [id number]  HPI  Pt in with c/o uncontrolled generalized pain, esp both hands, right more than left, also left neck, shoulder arm and leg, has disc disease Non compliant with CPAP and does not take anti hypertensive meds.which are recommended Getting care primarily through the [place]   Review of Systems See HPI Denies recent fever or chills. Denies sinus pressure, nasal congestion, ear pain or sore throat. Denies chest congestion, productive cough or wheezing. Denies chest pains, palpitations and leg swelling Denies abdominal pain, nausea, vomiting,diarrhea or constipation.   Denies dysuria, frequency, hesitancy or incontinence.  Denies headaches, seizures, numbness, or tingling. Denies uncontrolled  depression, anxiety or insomnia. Denies skin break down or rash.        Objective:   Physical Exam Patient alert and oriented and in no cardiopulmonary distress.  HEENT: No facial asymmetry, EOMI, no sinus tenderness,  oropharynx pink and moist.  Neck decreased ROM  no adenopathy.  Chest: Clear to auscultation bilaterally.  CVS: S1, S2 no murmurs, no S3.  ABD: Soft non tender. Bowel sounds normal.  Ext: No edema  MS: decreased  ROM spine, shoulders, hips and knees.  Skin: Intact, no ulcerations or rash noted.  Psych: Good eye contact, normal affect. Memory intact not anxious or depressed appearing.  CNS: CN 2-12 intact, power, tone and sensation normal throughout.        Assessment & Plan:

## 2011-11-16 DIAGNOSIS — I10 Essential (primary) hypertension: Secondary | ICD-10-CM | POA: Diagnosis not present

## 2011-11-16 DIAGNOSIS — IMO0002 Reserved for concepts with insufficient information to code with codable children: Secondary | ICD-10-CM

## 2011-11-16 MED ORDER — KETOROLAC TROMETHAMINE 60 MG/2ML IJ SOLN
60.0000 mg | Freq: Once | INTRAMUSCULAR | Status: AC
  Administered 2011-11-16: 60 mg via INTRAMUSCULAR

## 2011-11-16 MED ORDER — METHYLPREDNISOLONE ACETATE 80 MG/ML IJ SUSP
80.0000 mg | Freq: Once | INTRAMUSCULAR | Status: AC
  Administered 2011-11-16: 80 mg via INTRAMUSCULAR

## 2011-11-18 NOTE — Assessment & Plan Note (Signed)
Generalized uncontrolled pain, anti inflammtories and vicodin

## 2011-11-18 NOTE — Assessment & Plan Note (Signed)
Increased and unconrolled

## 2011-11-18 NOTE — Assessment & Plan Note (Signed)
Unchanged. Patient re-educated about  the importance of commitment to a  minimum of 150 minutes of exercise per week. The importance of healthy food choices with portion control discussed. Encouraged to start a food diary, count calories and to consider  joining a support group. Sample diet sheets offered. Goals set by the patient for the next several months.    

## 2011-11-18 NOTE — Assessment & Plan Note (Signed)
Uncontrolled, non compliant with med. DASH diet and commitment to daily physical activity for a minimum of 30 minutes discussed and encouraged, as a part of hypertension management. The importance of attaining a healthy weight is also discussed. Clonidine prescribed

## 2011-11-18 NOTE — Assessment & Plan Note (Signed)
Treated through the [facility]

## 2011-11-18 NOTE — Assessment & Plan Note (Signed)
Hyperlipidemia:Low fat diet discussed and encouraged.  Labs and management through the [place]

## 2012-01-16 ENCOUNTER — Other Ambulatory Visit: Payer: Self-pay | Admitting: Family Medicine

## 2012-01-16 ENCOUNTER — Encounter: Payer: Self-pay | Admitting: Family Medicine

## 2012-01-16 ENCOUNTER — Ambulatory Visit (INDEPENDENT_AMBULATORY_CARE_PROVIDER_SITE_OTHER): Payer: Medicare Other | Admitting: Family Medicine

## 2012-01-16 VITALS — BP 160/88 | HR 80 | Resp 16 | Ht 72.0 in | Wt 279.8 lb

## 2012-01-16 DIAGNOSIS — F4310 Post-traumatic stress disorder, unspecified: Secondary | ICD-10-CM

## 2012-01-16 DIAGNOSIS — R7301 Impaired fasting glucose: Secondary | ICD-10-CM

## 2012-01-16 DIAGNOSIS — M479 Spondylosis, unspecified: Secondary | ICD-10-CM

## 2012-01-16 DIAGNOSIS — F329 Major depressive disorder, single episode, unspecified: Secondary | ICD-10-CM

## 2012-01-16 DIAGNOSIS — M109 Gout, unspecified: Secondary | ICD-10-CM

## 2012-01-16 DIAGNOSIS — E669 Obesity, unspecified: Secondary | ICD-10-CM

## 2012-01-16 DIAGNOSIS — Z125 Encounter for screening for malignant neoplasm of prostate: Secondary | ICD-10-CM | POA: Diagnosis not present

## 2012-01-16 DIAGNOSIS — E785 Hyperlipidemia, unspecified: Secondary | ICD-10-CM | POA: Diagnosis not present

## 2012-01-16 DIAGNOSIS — I10 Essential (primary) hypertension: Secondary | ICD-10-CM

## 2012-01-16 DIAGNOSIS — D539 Nutritional anemia, unspecified: Secondary | ICD-10-CM | POA: Diagnosis not present

## 2012-01-16 DIAGNOSIS — F32A Depression, unspecified: Secondary | ICD-10-CM | POA: Insufficient documentation

## 2012-01-16 MED ORDER — KETOROLAC TROMETHAMINE 60 MG/2ML IJ SOLN
60.0000 mg | Freq: Once | INTRAMUSCULAR | Status: AC
  Administered 2012-01-16: 60 mg via INTRAMUSCULAR

## 2012-01-16 MED ORDER — FLUOXETINE HCL 10 MG PO CAPS: 10.0000 mg | ORAL_CAPSULE | Freq: Every day | ORAL | Status: DC

## 2012-01-16 MED ORDER — HYDROCODONE-ACETAMINOPHEN 7.5-300 MG PO TABS: 1.0000 | ORAL_TABLET | Freq: Every day | ORAL | Status: AC

## 2012-01-16 NOTE — Patient Instructions (Addendum)
F/u in 3 month  Toradol 60 mg Im in office for neck pain and vicodin ONE at bedtime is sent  for the next 3 month  You will complete a formal depression screen today, and , as we discussed, start prozac (fluoxetine) 10 mg one daily, NO LATER than next week Monday. Please collect at the pharmacy today so you have it.  Blood pressure is still too high, so please change eating to regular and small amounts, and work on weight loss. You will feel better once you take medication for your nerves   CBC, fasting cmp, lipid, HBa1C, TSH and pSA

## 2012-01-16 NOTE — Assessment & Plan Note (Signed)
Uncontrolled, toradol administered, refill vicodin x 3

## 2012-01-17 DIAGNOSIS — E785 Hyperlipidemia, unspecified: Secondary | ICD-10-CM | POA: Diagnosis not present

## 2012-01-17 DIAGNOSIS — Z125 Encounter for screening for malignant neoplasm of prostate: Secondary | ICD-10-CM | POA: Diagnosis not present

## 2012-01-17 DIAGNOSIS — F329 Major depressive disorder, single episode, unspecified: Secondary | ICD-10-CM | POA: Diagnosis not present

## 2012-01-17 DIAGNOSIS — I10 Essential (primary) hypertension: Secondary | ICD-10-CM | POA: Diagnosis not present

## 2012-01-17 DIAGNOSIS — R7301 Impaired fasting glucose: Secondary | ICD-10-CM | POA: Diagnosis not present

## 2012-01-17 LAB — COMPREHENSIVE METABOLIC PANEL
ALT: 26 U/L (ref 0–53)
AST: 26 U/L (ref 0–37)
Albumin: 4.3 g/dL (ref 3.5–5.2)
Alkaline Phosphatase: 93 U/L (ref 39–117)
Glucose, Bld: 95 mg/dL (ref 70–99)
Potassium: 4.9 mEq/L (ref 3.5–5.3)
Sodium: 137 mEq/L (ref 135–145)
Total Bilirubin: 0.4 mg/dL (ref 0.3–1.2)
Total Protein: 7.8 g/dL (ref 6.0–8.3)

## 2012-01-17 LAB — CBC
Hemoglobin: 11.9 g/dL — ABNORMAL LOW (ref 13.0–17.0)
MCH: 26.7 pg (ref 26.0–34.0)
MCHC: 33.2 g/dL (ref 30.0–36.0)
Platelets: 210 10*3/uL (ref 150–400)
RDW: 17.3 % — ABNORMAL HIGH (ref 11.5–15.5)

## 2012-01-17 LAB — HEMOGLOBIN A1C
Hgb A1c MFr Bld: 6.1 % — ABNORMAL HIGH (ref <5.7)
Mean Plasma Glucose: 128 mg/dL — ABNORMAL HIGH (ref <117)

## 2012-01-17 LAB — LIPID PANEL
Cholesterol: 269 mg/dL — ABNORMAL HIGH (ref 0–200)
VLDL: 42 mg/dL — ABNORMAL HIGH (ref 0–40)

## 2012-01-17 LAB — TSH: TSH: 1.84 u[IU]/mL (ref 0.350–4.500)

## 2012-01-18 LAB — VITAMIN B12: Vitamin B-12: 292 pg/mL (ref 211–911)

## 2012-01-19 NOTE — Assessment & Plan Note (Signed)
No recent flares 

## 2012-01-19 NOTE — Assessment & Plan Note (Signed)
Severe, based on history. Not suicidal or homicidal. Start medication, already in therapy through the [place]

## 2012-01-19 NOTE — Assessment & Plan Note (Signed)
Uncontrolled, pt non compliant with treatment. Based on labs received after visit, he will be encouraged to resume pravastatin

## 2012-01-19 NOTE — Assessment & Plan Note (Signed)
Worsening symptoms, pt encouraged to continue with therapy

## 2012-01-19 NOTE — Assessment & Plan Note (Signed)
Deteriorated. Patient re-educated about  the importance of commitment to a  minimum of 150 minutes of exercise per week. The importance of healthy food choices with portion control discussed. Encouraged to start a food diary, count calories and to consider  joining a support group. Sample diet sheets offered. Goals set by the patient for the next several months.    

## 2012-01-19 NOTE — Assessment & Plan Note (Signed)
Uncontrolled, no med change, med compliance  Stressed. DASH diet and commitment to daily physical activity for a minimum of 30 minutes discussed and encouraged, as a part of hypertension management. The importance of attaining a healthy weight is also discussed.

## 2012-01-19 NOTE — Progress Notes (Signed)
  Subjective:    Patient ID: [name], male    DOB: 1945-10-31, 67 y.o.   MRN: [id number]  HPI The PT is here for follow up and re-evaluation of chronic medical conditions, medication management and review of any available recent lab and radiology data.  Preventive health is updated, specifically  Cancer screening and Immunization.   C/o uncontrolled neck and left upper extremity pride. C/o increased irritability, anxiety, social withdrawal and depression. Not suicidal or homicidal C/o weight gain   Review of Systems See HPI Denies recent fever or chills. Denies sinus pressure, nasal congestion, ear pain or sore throat. Denies chest congestion, productive cough or wheezing. Denies chest pains, palpitations and leg swelling Denies abdominal pain, nausea, vomiting,diarrhea or constipation.   Denies dysuria, frequency, hesitancy or incontinence.  Denies headaches, seizures, numbness, or tingling.  Denies skin break down or rash.        Objective:   Physical Exam  Patient alert and oriented and in no cardiopulmonary distress.  HEENT: No facial asymmetry, EOMI, no sinus tenderness,  oropharynx pink and moist.  Neck decreased ROM, no adenopathy.  Chest: Clear to auscultation bilaterally.  CVS: S1, S2 no murmurs, no S3.  ABD: Soft non tender. Bowel sounds normal.  Ext: No edema  MS: decreased ROM spine,adequate in  shoulders, hips and knees.  Skin: Intact,lipoma left neck  Psych: Good eye contact, normal affect. Memory intact  anxious and  depressed appearing.  CNS: CN 2-12 intact, power, tone and sensation normal throughout.       Assessment & Plan:

## 2012-02-16 ENCOUNTER — Other Ambulatory Visit: Payer: Self-pay

## 2012-04-29 ENCOUNTER — Encounter: Payer: Self-pay | Admitting: Family Medicine

## 2012-04-29 ENCOUNTER — Ambulatory Visit: Payer: Medicare Other | Admitting: Family Medicine

## 2012-05-23 ENCOUNTER — Telehealth: Payer: Self-pay

## 2012-05-23 DIAGNOSIS — F329 Major depressive disorder, single episode, unspecified: Secondary | ICD-10-CM

## 2012-05-23 MED ORDER — FLUOXETINE HCL 20 MG PO CAPS: 20.0000 mg | ORAL_CAPSULE | Freq: Every day | ORAL | Status: DC

## 2012-05-23 NOTE — Telephone Encounter (Signed)
Okay to send in prozac 20mg , #30 tablets , No refills

## 2012-05-23 NOTE — Addendum Note (Signed)
Addended by: Kandis Fantasia B on: 05/23/2012 01:54 PM   Modules accepted: Orders

## 2012-05-23 NOTE — Telephone Encounter (Signed)
Wife aware and med sent.

## 2012-11-06 ENCOUNTER — Other Ambulatory Visit: Payer: Self-pay

## 2012-12-01 ENCOUNTER — Encounter: Payer: Self-pay | Admitting: Family Medicine

## 2012-12-01 ENCOUNTER — Ambulatory Visit (INDEPENDENT_AMBULATORY_CARE_PROVIDER_SITE_OTHER): Payer: Medicare Other | Admitting: Family Medicine

## 2012-12-01 VITALS — BP 152/84 | HR 74 | Resp 18 | Ht 72.0 in | Wt 263.0 lb

## 2012-12-01 DIAGNOSIS — N138 Other obstructive and reflux uropathy: Secondary | ICD-10-CM

## 2012-12-01 DIAGNOSIS — F32A Depression, unspecified: Secondary | ICD-10-CM

## 2012-12-01 DIAGNOSIS — F528 Other sexual dysfunction not due to a substance or known physiological condition: Secondary | ICD-10-CM | POA: Diagnosis not present

## 2012-12-01 DIAGNOSIS — F4310 Post-traumatic stress disorder, unspecified: Secondary | ICD-10-CM

## 2012-12-01 DIAGNOSIS — N401 Enlarged prostate with lower urinary tract symptoms: Secondary | ICD-10-CM

## 2012-12-01 DIAGNOSIS — R7301 Impaired fasting glucose: Secondary | ICD-10-CM | POA: Diagnosis not present

## 2012-12-01 DIAGNOSIS — E669 Obesity, unspecified: Secondary | ICD-10-CM

## 2012-12-01 DIAGNOSIS — I10 Essential (primary) hypertension: Secondary | ICD-10-CM | POA: Diagnosis not present

## 2012-12-01 DIAGNOSIS — E785 Hyperlipidemia, unspecified: Secondary | ICD-10-CM

## 2012-12-01 DIAGNOSIS — E739 Lactose intolerance, unspecified: Secondary | ICD-10-CM

## 2012-12-01 DIAGNOSIS — F329 Major depressive disorder, single episode, unspecified: Secondary | ICD-10-CM

## 2012-12-01 MED ORDER — SILDENAFIL CITRATE 100 MG PO TABS: 100.0000 mg | ORAL_TABLET | ORAL | Status: DC | PRN

## 2012-12-01 NOTE — Patient Instructions (Addendum)
F/u in 8 weeks , call if you need me before  Nurse visit on Friday for flu vaccine if you do not get this at the [facility] this week please  Blood pressure is high, you need to take your medication every day for your BP  Please start HALF sertraline daily, and discuss with your Provider about the S/E so you can have this changed. I suggest wellbutrin, for the least sexual s/e  You will get TWO viagra 100mg  tabs, BREAK in half, effect should begin within 1 hour, Do NOT use more often than every three days, check at [facility] for the medication if it works for you  Labs today are chem 7, HBa1C, TSH , lipid and cbc

## 2012-12-01 NOTE — Progress Notes (Signed)
   Subjective:    Patient ID: [name], male    DOB: 12-27-1945, 67 y.o.   MRN: [id number]  HPI The PT is here for follow up and re-evaluation of chronic medical conditions, medication management and review of any available recent lab and radiology data.  Preventive health is updated, specifically  Cancer screening and Immunization.  Opting to defer needed immunization to the [place] which he will visit this week  The PT denies any adverse reactions to current medications since the last visit.  C/o worsened depression and stress and frustration as he is having worsened Ed issues, both he and his wife feel , since starting current dose of anti depressant , which he gets treatment from thru the [place] for PTSD.not suicidal or homicidal, but poor self esteem, frustration and irritability Wants to "be checked" to see if safe for him to use viagra for help with ED     Review of Systems See HPI Denies recent fever or chills. Denies sinus pressure, nasal congestion, ear pain or sore throat. Denies chest congestion, productive cough or wheezing. Denies chest pains, palpitations and leg swelling Denies abdominal pain, nausea, vomiting,diarrhea or constipation.   Denies dysuria, frequency, hesitancy or incontinence. Denies joint pain, swelling and limitation in mobility. Denies headaches, seizures, numbness, or tingling.  Denies skin break down or rash.        Objective:   Physical Exam Patient alert and oriented and in no cardiopulmonary distress.  HEENT: No facial asymmetry, EOMI, no sinus tenderness,  oropharynx pink and moist.  Neck supple no adenopathy.  Chest: Clear to auscultation bilaterally.  CVS: S1, S2 no murmurs, no S3.  ABD: Soft non tender. Bowel sounds normal.  Ext: No edema  MS: Adequate ROM spine, shoulders, hips and knees.  Skin: Intact, no ulcerations or rash noted.  Psych: Good eye contact, depressed  affect. Memory intact  anxious and  depressed  appearing.  CNS: CN 2-12 intact, power, tone and sensation normal throughout.        Assessment & Plan:

## 2012-12-07 NOTE — Assessment & Plan Note (Signed)
Patient educated about the importance of limiting  Carbohydrate intake , the need to commit to daily physical activity for a minimum of 30 minutes , and to commit weight loss. The fact that changes in all these areas will reduce or eliminate all together the development of diabetes is stressed.   Updated lab today 

## 2012-12-07 NOTE — Assessment & Plan Note (Addendum)
Improved. Pt applauded on succesful weight loss through lifestyle change, and encouraged to continue same. Weight loss goal set for the next several months.  

## 2012-12-07 NOTE — Assessment & Plan Note (Signed)
Uncontrolled, due to inconsistently taking medication. DASH diet and commitment to daily physical activity for a minimum of 30 minutes discussed and encouraged, as a part of hypertension management. The importance of attaining a healthy weight is also discussed.

## 2012-12-07 NOTE — Assessment & Plan Note (Signed)
Managed through the [place]

## 2012-12-07 NOTE — Assessment & Plan Note (Signed)
Deteriorated, Blood sugar status needs to be addressed. Samples of viagra provided, [facility]

## 2012-12-07 NOTE — Assessment & Plan Note (Signed)
Uncontrolled, not suicidal or homicidal. Needs med adjustment thru treating provider at the [place]. Advised to reduce current sertraline dose in half due to sexual  s/e

## 2012-12-07 NOTE — Assessment & Plan Note (Signed)
Symptoms relieved with flomax, continue same

## 2012-12-07 NOTE — Assessment & Plan Note (Signed)
Hyperlipidemia:Low fat diet discussed and encouraged.  Updated lb today

## 2013-01-09 ENCOUNTER — Telehealth: Payer: Self-pay

## 2013-01-09 ENCOUNTER — Encounter (HOSPITAL_COMMUNITY): Payer: Self-pay | Admitting: Emergency Medicine

## 2013-01-09 ENCOUNTER — Emergency Department (HOSPITAL_COMMUNITY)
Admission: EM | Admit: 2013-01-09 | Discharge: 2013-01-09 | Disposition: A | Discharge status: Home / Self Care | Payer: Medicare Other | Attending: Emergency Medicine | Admitting: Emergency Medicine

## 2013-01-09 DIAGNOSIS — Z8639 Personal history of other endocrine, nutritional and metabolic disease: Secondary | ICD-10-CM | POA: Insufficient documentation

## 2013-01-09 DIAGNOSIS — Z7982 Long term (current) use of aspirin: Secondary | ICD-10-CM | POA: Diagnosis not present

## 2013-01-09 DIAGNOSIS — Z79899 Other long term (current) drug therapy: Secondary | ICD-10-CM | POA: Insufficient documentation

## 2013-01-09 DIAGNOSIS — M479 Spondylosis, unspecified: Secondary | ICD-10-CM | POA: Insufficient documentation

## 2013-01-09 DIAGNOSIS — F4310 Post-traumatic stress disorder, unspecified: Secondary | ICD-10-CM | POA: Insufficient documentation

## 2013-01-09 DIAGNOSIS — Z872 Personal history of diseases of the skin and subcutaneous tissue: Secondary | ICD-10-CM | POA: Diagnosis not present

## 2013-01-09 DIAGNOSIS — E785 Hyperlipidemia, unspecified: Secondary | ICD-10-CM | POA: Insufficient documentation

## 2013-01-09 DIAGNOSIS — G8929 Other chronic pain: Secondary | ICD-10-CM | POA: Diagnosis not present

## 2013-01-09 DIAGNOSIS — M549 Dorsalgia, unspecified: Secondary | ICD-10-CM

## 2013-01-09 DIAGNOSIS — M109 Gout, unspecified: Secondary | ICD-10-CM | POA: Diagnosis not present

## 2013-01-09 DIAGNOSIS — F329 Major depressive disorder, single episode, unspecified: Secondary | ICD-10-CM | POA: Insufficient documentation

## 2013-01-09 DIAGNOSIS — Z9889 Other specified postprocedural states: Secondary | ICD-10-CM | POA: Insufficient documentation

## 2013-01-09 DIAGNOSIS — N400 Enlarged prostate without lower urinary tract symptoms: Secondary | ICD-10-CM | POA: Diagnosis not present

## 2013-01-09 DIAGNOSIS — E669 Obesity, unspecified: Secondary | ICD-10-CM | POA: Diagnosis not present

## 2013-01-09 DIAGNOSIS — M545 Low back pain, unspecified: Secondary | ICD-10-CM | POA: Diagnosis not present

## 2013-01-09 DIAGNOSIS — Z87891 Personal history of nicotine dependence: Secondary | ICD-10-CM | POA: Insufficient documentation

## 2013-01-09 DIAGNOSIS — F3289 Other specified depressive episodes: Secondary | ICD-10-CM | POA: Insufficient documentation

## 2013-01-09 DIAGNOSIS — I10 Essential (primary) hypertension: Secondary | ICD-10-CM | POA: Diagnosis not present

## 2013-01-09 LAB — URINALYSIS, ROUTINE W REFLEX MICROSCOPIC
Bilirubin Urine: NEGATIVE
Glucose, UA: NEGATIVE mg/dL
Hgb urine dipstick: NEGATIVE
Ketones, ur: NEGATIVE mg/dL
Leukocytes, UA: NEGATIVE
Nitrite: NEGATIVE
Protein, ur: NEGATIVE mg/dL
Specific Gravity, Urine: 1.015 (ref 1.005–1.030)
UROBILINOGEN UA: 0.2 mg/dL (ref 0.0–1.0)
pH: 6 (ref 5.0–8.0)

## 2013-01-09 LAB — CBC WITH DIFFERENTIAL/PLATELET
Basophils Absolute: 0 10*3/uL (ref 0.0–0.1)
Basophils Relative: 0 % (ref 0–1)
Eosinophils Absolute: 0.2 10*3/uL (ref 0.0–0.7)
Eosinophils Relative: 5 % (ref 0–5)
HCT: 36.4 % — ABNORMAL LOW (ref 39.0–52.0)
Hemoglobin: 11.8 g/dL — ABNORMAL LOW (ref 13.0–17.0)
LYMPHS ABS: 1.6 10*3/uL (ref 0.7–4.0)
Lymphocytes Relative: 35 % (ref 12–46)
MCH: 26.9 pg (ref 26.0–34.0)
MCHC: 32.4 g/dL (ref 30.0–36.0)
MCV: 83.1 fL (ref 78.0–100.0)
Monocytes Absolute: 0.4 10*3/uL (ref 0.1–1.0)
Monocytes Relative: 9 % (ref 3–12)
NEUTROS PCT: 51 % (ref 43–77)
Neutro Abs: 2.3 10*3/uL (ref 1.7–7.7)
PLATELETS: 187 10*3/uL (ref 150–400)
RBC: 4.38 MIL/uL (ref 4.22–5.81)
RDW: 16.3 % — ABNORMAL HIGH (ref 11.5–15.5)
WBC: 4.5 10*3/uL (ref 4.0–10.5)

## 2013-01-09 LAB — ETHANOL

## 2013-01-09 LAB — RAPID URINE DRUG SCREEN, HOSP PERFORMED
Amphetamines: NOT DETECTED
Barbiturates: NOT DETECTED
Benzodiazepines: NOT DETECTED
Cocaine: NOT DETECTED
OPIATES: NOT DETECTED
TETRAHYDROCANNABINOL: NOT DETECTED

## 2013-01-09 LAB — BASIC METABOLIC PANEL
BUN: 14 mg/dL (ref 6–23)
CHLORIDE: 105 meq/L (ref 96–112)
CO2: 22 meq/L (ref 19–32)
Calcium: 10.2 mg/dL (ref 8.4–10.5)
Creatinine, Ser: 1.1 mg/dL (ref 0.50–1.35)
GFR calc Af Amer: 78 mL/min — ABNORMAL LOW (ref >90)
GFR calc non Af Amer: 68 mL/min — ABNORMAL LOW (ref >90)
Glucose, Bld: 95 mg/dL (ref 70–99)
Potassium: 4.5 mEq/L (ref 3.7–5.3)
Sodium: 139 mEq/L (ref 137–147)

## 2013-01-09 MED ORDER — DIPHENHYDRAMINE HCL 50 MG/ML IJ SOLN
25.0000 mg | Freq: Once | INTRAMUSCULAR | Status: AC
  Administered 2013-01-09: 25 mg via INTRAVENOUS

## 2013-01-09 MED ORDER — OXYCODONE-ACETAMINOPHEN 10-325 MG PO TABS: 1.0000 | ORAL_TABLET | Freq: Four times a day (QID) | ORAL | Status: DC | PRN

## 2013-01-09 MED ORDER — DIPHENHYDRAMINE HCL 50 MG/ML IJ SOLN
INTRAMUSCULAR | Status: AC
  Administered 2013-01-09: 16:00:00 25 mg via INTRAVENOUS
  Filled 2013-01-09: qty 1

## 2013-01-09 MED ORDER — LORAZEPAM 2 MG/ML IJ SOLN
0.5000 mg | Freq: Once | INTRAMUSCULAR | Status: AC
  Administered 2013-01-09: 0.5 mg via INTRAVENOUS
  Filled 2013-01-09: qty 1

## 2013-01-09 MED ORDER — HYDROMORPHONE HCL PF 2 MG/ML IJ SOLN
2.0000 mg | Freq: Once | INTRAMUSCULAR | Status: AC
  Administered 2013-01-09: 2 mg via INTRAMUSCULAR
  Filled 2013-01-09: qty 1

## 2013-01-09 MED ORDER — KETOROLAC TROMETHAMINE 30 MG/ML IJ SOLN
30.0000 mg | Freq: Once | INTRAMUSCULAR | Status: AC
  Administered 2013-01-09: 30 mg via INTRAVENOUS
  Filled 2013-01-09: qty 1

## 2013-01-09 MED ORDER — HYDROMORPHONE HCL PF 1 MG/ML IJ SOLN
1.0000 mg | Freq: Once | INTRAMUSCULAR | Status: AC
  Administered 2013-01-09: 1 mg via INTRAVENOUS
  Filled 2013-01-09: qty 1

## 2013-01-09 MED ORDER — METHYLPREDNISOLONE SODIUM SUCC 125 MG IJ SOLR
125.0000 mg | Freq: Once | INTRAMUSCULAR | Status: AC
  Administered 2013-01-09: 125 mg via INTRAVENOUS
  Filled 2013-01-09: qty 2

## 2013-01-09 MED ORDER — PREDNISONE 10 MG PO TABS: 20.0000 mg | ORAL_TABLET | Freq: Every day | ORAL | Status: DC

## 2013-01-09 NOTE — Telephone Encounter (Signed)
Patient continues to be in the Ed.

## 2013-01-09 NOTE — ED Notes (Signed)
Went in to assess pt. Pt becoming agitated and very loud. Pt consolable but reports " I should have just taken a handful of hydrocodone pills before I came." I called my PCP and she said to come up here for evaluation but " I could have waited at home. Pt informed of delay and care plan. Pt verbalized understanding but remains upset. Charge RN aware.

## 2013-01-09 NOTE — ED Notes (Signed)
Pt states he is in a lot of pain and he wants to stop hurting. Sates he has had a back problem for a long time and is on disability for it. States hydrocodone is not working any longer for him and he had been on four antidepressants that have not worked for him. States he has though about taking a hand full of pills.

## 2013-01-09 NOTE — ED Provider Notes (Signed)
CSN: [id number]     Arrival date & time 01/09/13  1120 History  This chart was scribed for Maudry Diego, MD by Roxan Diesel, ED scribe.  This patient was seen in room APA16A/APA16A and the patient's care was started at 1:04 PM.   Chief Complaint  Patient presents with  . V70.1    Patient is a 68 y.o. male presenting with back pain. The history is provided by the patient. No language interpreter was used.  Back Pain Location:  Lumbar spine Radiates to:  L posterior upper leg and R posterior upper leg Pain severity:  Severe Duration: 6 years. Chronicity:  Chronic Ineffective treatments: hydrocodone. Associated symptoms: no abdominal pain, no chest pain and no headaches     HPI Comments: [name] is a 68 y.o. male who presents to the Emergency Department complaining of severe chronic back pain that has been ongoing for 6 years.  Pain radiates into bilateral posterior legs.  Pt has a h/o back surgery around 6 years ago and has had this pain since then.  He has been taking hydrocodone which he states is no longer working for him.  Per triage nurse, pt was initially stating that he had thought about taking a "hand full of pills."  He does not express any suicidal ideation currently.   Past Medical History  Diagnosis Date  . ED (erectile dysfunction)   . Vitamin D deficiency   . IGT (impaired glucose tolerance)   . Gout   . Obesity   . Hematospermia   . Back pain   . PTSD (post-traumatic stress disorder)   . Chronic depression   . Benign prostatic hypertrophy   . Lipoma   . DJD (degenerative joint disease) of cervical spine   . Nicotine addiction   . HTN (hypertension)   . Hyperlipidemia     Past Surgical History  Procedure Laterality Date  . Cholecystectomy    . Appendectomy    . Right bunionectomy    . Wisdom tooth extraction      Family History  Problem Relation Age of Onset  . Heart disease Mother   . Hypertension Mother   . Diabetes Brother      History  Substance Use Topics  . Smoking status: Former Smoker -- 1 years  . Smokeless tobacco: Not on file  . Alcohol Use: No     Review of Systems  Constitutional: Negative for appetite change and fatigue.  HENT: Negative for congestion, ear discharge and sinus pressure.   Eyes: Negative for discharge.  Respiratory: Negative for cough.   Cardiovascular: Negative for chest pain.  Gastrointestinal: Negative for abdominal pain and diarrhea.  Genitourinary: Negative for frequency and hematuria.  Musculoskeletal: Negative for back pain.  Skin: Negative for rash.  Neurological: Negative for seizures and headaches.  Psychiatric/Behavioral: Negative for hallucinations.     Allergies  Review of patient's allergies indicates no known allergies.  Home Medications   Current Outpatient Rx  Name  Route  Sig  Dispense  Refill  . aspirin 81 MG tablet   Oral   Take 81 mg by mouth daily.           . cloNIDine (CATAPRES) 0.2 MG tablet      One tablet at bedtime daily, for high blood pressure   30 tablet   11   . ibuprofen (ADVIL,MOTRIN) 800 MG tablet   Oral   Take 800 mg by mouth every 8 (eight) hours as needed.         Marland Kitchen  indomethacin (INDOCIN) 50 MG capsule   Oral   Take 50 mg by mouth 3 (three) times daily with meals.         . pravastatin (PRAVACHOL) 80 MG tablet   Oral   Take 80 mg by mouth every evening.         . sertraline (ZOLOFT) 100 MG tablet   Oral   Take 100 mg by mouth daily.         . sildenafil (VIAGRA) 100 MG tablet   Oral   Take 1 tablet (100 mg total) by mouth as needed for erectile dysfunction.   2 tablet   0   . Tamsulosin HCl (FLOMAX) 0.4 MG CAPS   Oral   Take 1 capsule (0.4 mg total) by mouth daily after supper.   30 capsule   3    BP 179/89  Pulse 61  Temp(Src) 97.6 F (36.4 C) (Oral)  Resp 20  Ht 6\' 2"  (1.88 m)  Wt 262 lb (118.842 kg)  BMI 33.62 kg/m2  SpO2 96%  Physical Exam  Nursing note and vitals  reviewed. Constitutional: He is oriented to person, place, and time. He appears well-developed and well-nourished.  HENT:  Head: Normocephalic and atraumatic.  Eyes: Conjunctivae and EOM are normal. Pupils are equal, round, and reactive to light.  Neck: Normal range of motion. Neck supple.  Cardiovascular: Normal rate, regular rhythm and normal heart sounds.   Pulmonary/Chest: Effort normal and breath sounds normal.  Abdominal: Soft. Bowel sounds are normal.  Musculoskeletal: Normal range of motion.       Lumbar back: He exhibits tenderness.  Well-healed scar lower back, with some tenderness  Neurological: He is alert and oriented to person, place, and time.  Skin: Skin is warm and dry.  Psychiatric: He has a normal mood and affect. His behavior is normal. He expresses no suicidal ideation.    ED Course  Procedures (including critical care time)  DIAGNOSTIC STUDIES: Oxygen Saturation is 96% on room air, normal by my interpretation.    COORDINATION OF CARE: 1:08 PM-Discussed treatment plan which includes pain medication with pt at bedside and pt agreed to plan.    Labs Review Labs Reviewed  CBC WITH DIFFERENTIAL - Abnormal; Notable for the following:    Hemoglobin 11.8 (*)    HCT 36.4 (*)    RDW 16.3 (*)    All other components within normal limits  BASIC METABOLIC PANEL - Abnormal; Notable for the following:    GFR calc non Af Amer 68 (*)    GFR calc Af Amer 78 (*)    All other components within normal limits  URINE RAPID DRUG SCREEN (HOSP PERFORMED)  URINALYSIS, ROUTINE W REFLEX MICROSCOPIC  ETHANOL    Imaging Review No results found.   EKG Interpretation   None       MDM  Chronic back pain.   Pt improved with tx    The chart was scribed for me under my direct supervision.  I personally performed the history, physical, and medical decision making and all procedures in the evaluation of this patient.Maudry Diego, MD 01/09/13 1556

## 2013-01-09 NOTE — Discharge Instructions (Signed)
Follow up as needed

## 2013-01-09 NOTE — Telephone Encounter (Signed)
Noted  

## 2013-01-09 NOTE — ED Notes (Addendum)
Pt c/o of generalized itching and is mildly diaphoretic. EDP aware and give verbal order to admin benadryl 25mg  IV.pt tolerated well.

## 2013-01-09 NOTE — ED Notes (Signed)
Pt calm and interactive with staff. nad noted.

## 2013-01-09 NOTE — Telephone Encounter (Signed)
[facility]  Absolutely needs to be in contact with them, let them know what is going on and plan to see them soon.He still ne has not had the labs I had requested at last visit , and it is important I see his blood work. He was on an antidepressant from me in the past which he and his wife report as being better tolerated , I will review record , if I see send in 1 month only, but he needs mental health care on a regular basis. Advise if he feels like hurting himself or someone ,he needs to go to ED Old antidepressanmt is prozac 20mg  one daily pls send in after you spk with him, currently in the Edm, so only send in #30 tabs if he is not started on something there

## 2013-01-12 ENCOUNTER — Ambulatory Visit (INDEPENDENT_AMBULATORY_CARE_PROVIDER_SITE_OTHER): Payer: Medicare Other | Admitting: Family Medicine

## 2013-01-12 ENCOUNTER — Encounter: Payer: Self-pay | Admitting: Family Medicine

## 2013-01-12 VITALS — BP 146/96 | HR 90 | Resp 18 | Ht 72.0 in | Wt 271.0 lb

## 2013-01-12 DIAGNOSIS — M542 Cervicalgia: Secondary | ICD-10-CM | POA: Diagnosis not present

## 2013-01-12 DIAGNOSIS — E8881 Metabolic syndrome: Secondary | ICD-10-CM

## 2013-01-12 DIAGNOSIS — N138 Other obstructive and reflux uropathy: Secondary | ICD-10-CM

## 2013-01-12 DIAGNOSIS — F4310 Post-traumatic stress disorder, unspecified: Secondary | ICD-10-CM

## 2013-01-12 DIAGNOSIS — E739 Lactose intolerance, unspecified: Secondary | ICD-10-CM

## 2013-01-12 DIAGNOSIS — Z23 Encounter for immunization: Secondary | ICD-10-CM

## 2013-01-12 DIAGNOSIS — M479 Spondylosis, unspecified: Secondary | ICD-10-CM | POA: Diagnosis not present

## 2013-01-12 DIAGNOSIS — N401 Enlarged prostate with lower urinary tract symptoms: Secondary | ICD-10-CM

## 2013-01-12 DIAGNOSIS — E785 Hyperlipidemia, unspecified: Secondary | ICD-10-CM | POA: Diagnosis not present

## 2013-01-12 DIAGNOSIS — IMO0002 Reserved for concepts with insufficient information to code with codable children: Secondary | ICD-10-CM

## 2013-01-12 DIAGNOSIS — G4733 Obstructive sleep apnea (adult) (pediatric): Secondary | ICD-10-CM

## 2013-01-12 DIAGNOSIS — E669 Obesity, unspecified: Secondary | ICD-10-CM

## 2013-01-12 DIAGNOSIS — F17200 Nicotine dependence, unspecified, uncomplicated: Secondary | ICD-10-CM

## 2013-01-12 DIAGNOSIS — I10 Essential (primary) hypertension: Secondary | ICD-10-CM

## 2013-01-12 NOTE — Progress Notes (Signed)
Subjective:    Patient ID: [name], male    DOB: 1945/04/28, 68 y.o.   MRN: [id number]  HPI 1 week h/o increased and uncontrolled low back pain radiating to knees down posterior thighs, left worse than right, was seen in the ED on Friday, unhappy with treatment, got dilaudid and depo medrol which afforded some relief , also got a script for oxycodne, he already has hydrocodone form the [place] which he has not been taking, he is advised and cautioned not to mix both. Also c/o significant neck pain radiating to left upper extremoity , which is becoming weaker with decreased sensation C/o uncontrolled depression and PTSD. Frequent awakenings due to nightmares and flash backs, staes he is becoming more withdrawn, anxious, irritable and fearful. Sometimes wishes he were dead, but has no plans to kill himself or anyone.  Smoking has increased due to uncontrolled stress and fear, although he wants to quit is unable to at this time   Review of Systems See HPI Denies recent fever or chills. Denies sinus pressure, nasal congestion, ear pain or sore throat. Denies chest congestion, productive cough or wheezing. Denies chest pains, palpitations and leg swelling Denies abdominal pain, nausea, vomiting,diarrhea or constipation.   Denies dysuria, frequency, hesitancy or incontinence. Denies  Seizures Rash on feet from [facility]        Objective:   Physical Exam BP 146/96  Pulse 90  Resp 18  Ht 6' (1.829 m)  Wt 271 lb (122.925 kg)  BMI 36.75 kg/m2  SpO2 98% Patient alert and oriented and in no cardiopulmonary distress.Pt anxious and tearful, mood is labile , crying sporadically  during exam , and speaking very loudly at times  HEENT: No facial asymmetry, EOMI, no sinus tenderness,  oropharynx pink and moist.  Neck decreased ROM with left trapezius spasm, no JVD, no adenopathy.  Chest: Clear to auscultation bilaterally.Decreased air entry  CVS: S1, S2 no murmurs,  no S3.  ABD: Soft non tender. Bowel sounds normal.  Ext: No edema  LF:[id number]  ROM lumbar  Spine and , shoulders,adequate in  hips and knees.  Skin: Intact, no ulcerations or rash noted.  Psych: Good eye contact, labile, anxious  affect. Memory intact  Anxious and  depressed appearing.  CNS: CN 2-12 intact, power, tone and sensation reduced in left upper extremity , with muscle wasting in left upper extremity        Assessment & Plan:  BACK PAIN WITH RADICULOPATHY Increased and uncontrolled Recently seen in the ED and oxycodone script given , pt already [facility] but not using consistently. Advised not to mix both Rept mRI of lumbar spine and refer to neurosurg for eval  DEGENERATIVE JOINT DISEASE, CERVICAL SPINE Uncontrolled neck pain radiating to left upper extremity which has muscle wasting and decreased sensation. MRI C spine  HYPERLIPIDEMIA Uncontrolled, Updated lab needed at/ before next visit. Hyperlipidemia:Low fat diet discussed and encouraged.    PTSD Uncontrolled. Severe symptoms, nightmares and poor sleep constantly. Socially withdrawn , with increased anxiety , poor impulse control, anger, fear and severe depression. Not actively suicidal or homicidal Needs either intense out pt therapy or inpatient care  HYPERTENSION Uncontrolled. No med change at this visit, pt in pain and very anxious and tearful. DASH diet and commitment to daily physical activity for a minimum of 30 minutes discussed and encouraged, as a part of hypertension management. The importance of attaining a healthy weight is also discussed.  IMPAIRED GLUCOSE TOLERANCE Patient educated about the importance of limiting  Carbohydrate intake , the need to commit to daily physical activity for a minimum of 30 minutes , and to commit weight loss. The fact that changes in all these areas will reduce or eliminate all together the development of diabetes is stressed.   Updated lab  needed at/ before next visit.   BENIGN PROSTATIC HYPERTROPHY, WITH URINARY OBSTRUCTION Improved flow with flomax, continue same  OBESITY Deteriorated. Patient re-educated about  the importance of commitment to a  minimum of 150 minutes of exercise per week. The importance of healthy food choices with portion control discussed. Encouraged to start a food diary, count calories and to consider  joining a support group. Sample diet sheets offered. Goals set by the patient for the next several months.     Nicotine dependence Deteriorated, wants to quit but unable to do so at this time due to poor mental health Patient counseled for approximately 5 minutes regarding the health risks of ongoing nicotine use, specifically all types of cancer, heart disease, stroke and respiratory failure. The options available for help with cessation ,the behavioral changes to assist the process, and the option to either gradully reduce usage  Or abruptly stop.is also discussed. Pt is also encouraged to set specific goals in number of cigarettes used daily, as well as to set a quit date.   Metabolic syndrome X The increased risk of cardiovascular disease associated with this diagnosis, and the need to consistently work on lifestyle to change this is discussed. Following  a  heart healthy diet ,commitment to 30 minutes of exercise at least 5 days per week, as well as control of blood sugar and cholesterol , and achieving a healthy weight are all the areas to be addressed .   Obstructive sleep apnea Inadequately treated due to PTSD pt has significant difficulty tolerating a face mask

## 2013-01-12 NOTE — Patient Instructions (Addendum)
F/u as before  You are referred for an MRI of your low back as well as for an MRI of your C spine.   You will be referred toDr Rita Ohara after you have had the films.  [facility], I believe you absolutely need and will benefit from either intense outpatient care or inpatient management. You may also see if you can get the needed services closer home  Please do NOT mix hydrocodone from the [place] with oxycodone from the ED  Flu vaccine today

## 2013-01-15 ENCOUNTER — Other Ambulatory Visit: Payer: Self-pay | Admitting: Family Medicine

## 2013-01-15 ENCOUNTER — Ambulatory Visit (HOSPITAL_COMMUNITY)
Admission: RE | Admit: 2013-01-15 | Discharge: 2013-01-15 | Disposition: A | Discharge status: Home / Self Care | Payer: Medicare Other | Source: Ambulatory Visit | Attending: Family Medicine | Admitting: Family Medicine

## 2013-01-15 DIAGNOSIS — R209 Unspecified disturbances of skin sensation: Secondary | ICD-10-CM | POA: Insufficient documentation

## 2013-01-15 DIAGNOSIS — M545 Low back pain, unspecified: Secondary | ICD-10-CM | POA: Diagnosis not present

## 2013-01-15 DIAGNOSIS — R29898 Other symptoms and signs involving the musculoskeletal system: Secondary | ICD-10-CM | POA: Insufficient documentation

## 2013-01-15 DIAGNOSIS — Z981 Arthrodesis status: Secondary | ICD-10-CM | POA: Insufficient documentation

## 2013-01-15 DIAGNOSIS — M48061 Spinal stenosis, lumbar region without neurogenic claudication: Secondary | ICD-10-CM | POA: Diagnosis not present

## 2013-01-15 DIAGNOSIS — M542 Cervicalgia: Secondary | ICD-10-CM

## 2013-01-15 DIAGNOSIS — M79609 Pain in unspecified limb: Secondary | ICD-10-CM | POA: Insufficient documentation

## 2013-01-15 DIAGNOSIS — M47817 Spondylosis without myelopathy or radiculopathy, lumbosacral region: Secondary | ICD-10-CM | POA: Insufficient documentation

## 2013-01-15 DIAGNOSIS — M5126 Other intervertebral disc displacement, lumbar region: Secondary | ICD-10-CM | POA: Diagnosis not present

## 2013-01-15 DIAGNOSIS — M4802 Spinal stenosis, cervical region: Secondary | ICD-10-CM | POA: Diagnosis not present

## 2013-01-15 DIAGNOSIS — M47812 Spondylosis without myelopathy or radiculopathy, cervical region: Secondary | ICD-10-CM | POA: Insufficient documentation

## 2013-01-15 DIAGNOSIS — M5020 Other cervical disc displacement, unspecified cervical region: Secondary | ICD-10-CM | POA: Diagnosis not present

## 2013-01-15 DIAGNOSIS — Z135 Encounter for screening for eye and ear disorders: Secondary | ICD-10-CM | POA: Diagnosis not present

## 2013-01-15 DIAGNOSIS — IMO0002 Reserved for concepts with insufficient information to code with codable children: Secondary | ICD-10-CM

## 2013-01-15 DIAGNOSIS — M5090 Cervical disc disorder, unspecified, unspecified cervical region: Secondary | ICD-10-CM

## 2013-01-15 DIAGNOSIS — M549 Dorsalgia, unspecified: Secondary | ICD-10-CM

## 2013-01-15 MED ORDER — GADOBENATE DIMEGLUMINE 529 MG/ML IV SOLN
20.0000 mL | Freq: Once | INTRAVENOUS | Status: AC | PRN
  Administered 2013-01-15: 20 mL via INTRAVENOUS

## 2013-01-30 DIAGNOSIS — M546 Pain in thoracic spine: Secondary | ICD-10-CM | POA: Diagnosis not present

## 2013-01-30 DIAGNOSIS — E669 Obesity, unspecified: Secondary | ICD-10-CM | POA: Diagnosis not present

## 2013-01-30 DIAGNOSIS — M545 Low back pain, unspecified: Secondary | ICD-10-CM | POA: Diagnosis not present

## 2013-01-30 DIAGNOSIS — M542 Cervicalgia: Secondary | ICD-10-CM | POA: Diagnosis not present

## 2013-02-02 ENCOUNTER — Other Ambulatory Visit: Payer: Self-pay | Admitting: Family Medicine

## 2013-02-02 DIAGNOSIS — E785 Hyperlipidemia, unspecified: Secondary | ICD-10-CM | POA: Diagnosis not present

## 2013-02-02 DIAGNOSIS — I10 Essential (primary) hypertension: Secondary | ICD-10-CM | POA: Diagnosis not present

## 2013-02-02 DIAGNOSIS — R7301 Impaired fasting glucose: Secondary | ICD-10-CM | POA: Diagnosis not present

## 2013-02-02 LAB — LIPID PANEL
Cholesterol: 247 mg/dL — ABNORMAL HIGH (ref 0–200)
HDL: 37 mg/dL — ABNORMAL LOW (ref >39)
LDL Cholesterol: 158 mg/dL — ABNORMAL HIGH (ref 0–99)
Total CHOL/HDL Ratio: 6.7 Ratio
Triglycerides: 258 mg/dL — ABNORMAL HIGH (ref <150)
VLDL: 52 mg/dL — ABNORMAL HIGH (ref 0–40)

## 2013-02-02 LAB — BASIC METABOLIC PANEL
BUN: 15 mg/dL (ref 6–23)
CO2: 26 mEq/L (ref 19–32)
Calcium: 10.3 mg/dL (ref 8.4–10.5)
Chloride: 108 mEq/L (ref 96–112)
Creat: 0.99 mg/dL (ref 0.50–1.35)
Glucose, Bld: 112 mg/dL — ABNORMAL HIGH (ref 70–99)
Potassium: 4.5 mEq/L (ref 3.5–5.3)
Sodium: 138 mEq/L (ref 135–145)

## 2013-02-03 ENCOUNTER — Other Ambulatory Visit: Payer: Self-pay | Admitting: Family Medicine

## 2013-02-03 ENCOUNTER — Ambulatory Visit (INDEPENDENT_AMBULATORY_CARE_PROVIDER_SITE_OTHER): Payer: Medicare Other | Admitting: Family Medicine

## 2013-02-03 ENCOUNTER — Encounter: Payer: Self-pay | Admitting: Family Medicine

## 2013-02-03 VITALS — BP 160/84 | HR 87 | Resp 16 | Wt 277.0 lb

## 2013-02-03 DIAGNOSIS — N138 Other obstructive and reflux uropathy: Secondary | ICD-10-CM | POA: Diagnosis not present

## 2013-02-03 DIAGNOSIS — IMO0002 Reserved for concepts with insufficient information to code with codable children: Secondary | ICD-10-CM

## 2013-02-03 DIAGNOSIS — G4733 Obstructive sleep apnea (adult) (pediatric): Secondary | ICD-10-CM

## 2013-02-03 DIAGNOSIS — N401 Enlarged prostate with lower urinary tract symptoms: Secondary | ICD-10-CM | POA: Diagnosis not present

## 2013-02-03 DIAGNOSIS — F17200 Nicotine dependence, unspecified, uncomplicated: Secondary | ICD-10-CM

## 2013-02-03 DIAGNOSIS — F4310 Post-traumatic stress disorder, unspecified: Secondary | ICD-10-CM

## 2013-02-03 DIAGNOSIS — F528 Other sexual dysfunction not due to a substance or known physiological condition: Secondary | ICD-10-CM

## 2013-02-03 DIAGNOSIS — I10 Essential (primary) hypertension: Secondary | ICD-10-CM

## 2013-02-03 LAB — CBC WITH DIFFERENTIAL/PLATELET
Basophils Absolute: 0 10*3/uL (ref 0.0–0.1)
Basophils Relative: 0 % (ref 0–1)
Eosinophils Absolute: 0.1 10*3/uL (ref 0.0–0.7)
Eosinophils Relative: 3 % (ref 0–5)
HCT: 36.6 % — ABNORMAL LOW (ref 39.0–52.0)
Hemoglobin: 11.9 g/dL — ABNORMAL LOW (ref 13.0–17.0)
Lymphocytes Relative: 27 % (ref 12–46)
Lymphs Abs: 1.1 10*3/uL (ref 0.7–4.0)
MCH: 26.4 pg (ref 26.0–34.0)
MCHC: 32.5 g/dL (ref 30.0–36.0)
MCV: 81.2 fL (ref 78.0–100.0)
Monocytes Absolute: 0.3 10*3/uL (ref 0.1–1.0)
Monocytes Relative: 8 % (ref 3–12)
Neutro Abs: 2.5 10*3/uL (ref 1.7–7.7)
Neutrophils Relative %: 62 % (ref 43–77)
Platelets: 146 10*3/uL — ABNORMAL LOW (ref 150–400)
RBC: 4.51 MIL/uL (ref 4.22–5.81)
RDW: 17.6 % — ABNORMAL HIGH (ref 11.5–15.5)
WBC: 4 10*3/uL (ref 4.0–10.5)

## 2013-02-03 LAB — HEPATIC FUNCTION PANEL
ALK PHOS: 87 U/L (ref 39–117)
ALT: 22 U/L (ref 0–53)
AST: 25 U/L (ref 0–37)
Albumin: 4.1 g/dL (ref 3.5–5.2)
BILIRUBIN INDIRECT: 0.3 mg/dL (ref 0.2–1.2)
Bilirubin, Direct: 0.1 mg/dL (ref 0.0–0.3)
Total Bilirubin: 0.4 mg/dL (ref 0.2–1.2)
Total Protein: 8 g/dL (ref 6.0–8.3)

## 2013-02-03 LAB — HEMOGLOBIN A1C
Hgb A1c MFr Bld: 6.2 % — ABNORMAL HIGH (ref <5.7)
Mean Plasma Glucose: 131 mg/dL — ABNORMAL HIGH (ref <117)

## 2013-02-03 LAB — TSH: TSH: 0.898 u[IU]/mL (ref 0.350–4.500)

## 2013-02-03 MED ORDER — ATORVASTATIN CALCIUM 20 MG PO TABS: 20.0000 mg | ORAL_TABLET | Freq: Every day | ORAL | Status: DC

## 2013-02-03 NOTE — Patient Instructions (Signed)
F/u in 5 weeks. BRING all medication bottles that you are taking to that visit.  For pain commit to tylenol 325 mg one every morning and one hydrocodone at 8pm at night New is gabapentin 100mg  one at 6am , and one at 6 pm, take the evening dose for 3 days before you start taking it twice daily   Join the [facility] and commit to going for exercise 5 days per week at a set time every morning, when you have a morning appt , plan to go in the evening  Get rid of spam and liver pudding eat mainly vegetable  And fruit, and eat the meals your wife fixes for you with white meat  Will mention sleep apnea and PTSD with disturbed sleep in your note  New medication   For cholesterol lipitor , we will send in and let you know once we are sure your liver functio is normal

## 2013-02-08 DIAGNOSIS — G4733 Obstructive sleep apnea (adult) (pediatric): Secondary | ICD-10-CM | POA: Insufficient documentation

## 2013-02-08 DIAGNOSIS — F17200 Nicotine dependence, unspecified, uncomplicated: Secondary | ICD-10-CM | POA: Insufficient documentation

## 2013-02-08 DIAGNOSIS — E8881 Metabolic syndrome: Secondary | ICD-10-CM | POA: Insufficient documentation

## 2013-02-08 NOTE — Assessment & Plan Note (Signed)
Patient educated about the importance of limiting  Carbohydrate intake , the need to commit to daily physical activity for a minimum of 30 minutes , and to commit weight loss. The fact that changes in all these areas will reduce or eliminate all together the development of diabetes is stressed.   Updated lab needed at/ before next visit.  

## 2013-02-08 NOTE — Assessment & Plan Note (Signed)
Uncontrolled, Updated lab needed at/ before next visit. Hyperlipidemia:Low fat diet discussed and encouraged.

## 2013-02-08 NOTE — Assessment & Plan Note (Signed)
The increased risk of cardiovascular disease associated with this diagnosis, and the need to consistently work on lifestyle to change this is discussed. Following  a  heart healthy diet ,commitment to 30 minutes of exercise at least 5 days per week, as well as control of blood sugar and cholesterol , and achieving a healthy weight are all the areas to be addressed .  

## 2013-02-08 NOTE — Assessment & Plan Note (Signed)
Improved flow with flomax, continue same

## 2013-02-08 NOTE — Assessment & Plan Note (Signed)
Deteriorated. Patient re-educated about  the importance of commitment to a  minimum of 150 minutes of exercise per week. The importance of healthy food choices with portion control discussed. Encouraged to start a food diary, count calories and to consider  joining a support group. Sample diet sheets offered. Goals set by the patient for the next several months.    

## 2013-02-08 NOTE — Assessment & Plan Note (Signed)
Inadequately treated due to PTSD pt has significant difficulty tolerating a face mask

## 2013-02-08 NOTE — Assessment & Plan Note (Signed)
Uncontrolled neck pain radiating to left upper extremity which has muscle wasting and decreased sensation. MRI C spine

## 2013-02-08 NOTE — Assessment & Plan Note (Signed)
Deteriorated, wants to quit but unable to do so at this time due to poor mental health Patient counseled for approximately 5 minutes regarding the health risks of ongoing nicotine use, specifically all types of cancer, heart disease, stroke and respiratory failure. The options available for help with cessation ,the behavioral changes to assist the process, and the option to either gradully reduce usage  Or abruptly stop.is also discussed. Pt is also encouraged to set specific goals in number of cigarettes used daily, as well as to set a quit date.

## 2013-02-08 NOTE — Assessment & Plan Note (Signed)
Increased and uncontrolled Recently seen in the ED and oxycodone script given , [facility] but not using [facility]. Advised not to mix both Rept mRI of lumbar spine and refer to neurosurg for eval

## 2013-02-08 NOTE — Assessment & Plan Note (Addendum)
Uncontrolled. Severe symptoms, nightmares and poor sleep constantly. Socially withdrawn , with increased anxiety , poor impulse control, anger, fear and severe depression. Not actively suicidal or homicidal Needs either intense out pt therapy or inpatient care

## 2013-02-08 NOTE — Assessment & Plan Note (Signed)
Uncontrolled. No med change at this visit, pt in pain and very anxious and tearful. DASH diet and commitment to daily physical activity for a minimum of 30 minutes discussed and encouraged, as a part of hypertension management. The importance of attaining a healthy weight is also discussed.

## 2013-02-19 DIAGNOSIS — M48061 Spinal stenosis, lumbar region without neurogenic claudication: Secondary | ICD-10-CM | POA: Diagnosis not present

## 2013-02-19 DIAGNOSIS — M47817 Spondylosis without myelopathy or radiculopathy, lumbosacral region: Secondary | ICD-10-CM | POA: Diagnosis not present

## 2013-02-19 DIAGNOSIS — M5137 Other intervertebral disc degeneration, lumbosacral region: Secondary | ICD-10-CM | POA: Diagnosis not present

## 2013-02-22 ENCOUNTER — Encounter: Payer: Self-pay | Admitting: Family Medicine

## 2013-02-22 NOTE — Assessment & Plan Note (Signed)
Difficulty using mask due to severe PTSD, hence chronic fatigue and organ damage due to untreated sleep apnea, [facility], both in terms of the PTSD and an alternative mask

## 2013-02-22 NOTE — Assessment & Plan Note (Signed)
Uncontrolled back pain, attempt made to have pt start on a regular schedule of pain med and not mix narcotics, which is not his intent

## 2013-02-22 NOTE — Assessment & Plan Note (Signed)
Uncontrolled, interferes with sleep recurrent nightmares, and also pt unable to wear CPAP with his PTSD so out of control, will address further at the [place]

## 2013-02-22 NOTE — Assessment & Plan Note (Signed)
Unable to set quoit date though he does want to do so, states nerves will not allow at this time Patient counseled for approximately 5 minutes regarding the health risks of ongoing nicotine use, specifically all types of cancer, heart disease, stroke and respiratory failure. The options available for help with cessation ,the behavioral changes to assist the process, and the option to either gradully reduce usage  Or abruptly stop.is also discussed. Pt is also encouraged to set specific goals in number of cigarettes used daily, as well as to set a quit date.

## 2013-02-22 NOTE — Assessment & Plan Note (Signed)
Uncontrolled, medication adjusted and increased DASH diet and commitment to daily physical activity for a minimum of 30 minutes discussed and encouraged, as a part of hypertension management. The importance of attaining a healthy weight is also discussed.

## 2013-02-22 NOTE — Assessment & Plan Note (Signed)
ongoing source of frustration, discussed use of a pump through urology, not too keen at this time

## 2013-02-22 NOTE — Progress Notes (Signed)
Subjective:    Patient ID: [name], male    DOB: June 08, 1945, 68 y.o.   MRN: [id number]  HPI The PT is here for follow up and re-evaluation of chronic medical conditions, medication management and review of any available recent lab and radiology data.  Preventive health is updated, specifically  Cancer screening and Immunization.   Recently in ED for uncontrolled pain, was prescribed oxycodone which he states makes him extremely sleepy and non functional. He already has hydrocodone form the [place]. His wife supervises his medication , but they are both fearful of oversedation. 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 Mental health continues to be in poor state , he is to try to get into out pt grp therapy , had wanted inpatient treatment but states turned down due to lack of a bed/;. Denies any active suicidal or homicidal plan Sleep is extremely poor. He repeatredly has violent and frightening dreams/nightmares, and though he has a diagnosis of sleep apnea, finds it near impossible to use the CPAP machine , therefore has chronic fatigue also. Continues to overeat with weight gain, and no regular exercise since weather is too cold for him to garden Recent re evalution by neurosurgeon, and has been advised against operation for his disc disease, he is comfortable with this    Review of Systems See HPI Denies recent fever or chills. Denies sinus pressure, nasal congestion, ear pain or sore throat. Denies chest congestion, productive cough or wheezing. Denies chest pains, palpitations and leg swelling Denies abdominal pain, nausea, vomiting,diarrhea or constipation.   Denies dysuria, frequency, hesitancy or incontinence.ED an ongoing source of frustration Severe and uncontrolled neck and back pain with established disease and nerve compression Denies , seizures, has  headaches  numbness, and  tingling.  Denies skin break down or rash.        Objective:   Physical Exam  BP 160/84  Pulse 87  Resp 16  Wt 277 lb (125.646 kg)  SpO2 97% Patient alert and oriented and in no cardiopulmonary distress.Tearful, and in pain, anxious   HEENT: No facial asymmetry, EOMI, no sinus tenderness,  oropharynx pink and moist.  Neck decreased ROM, no JVd, no adenopathy.  Chest: Clear to auscultation bilaterally.  CVS: S1, S2 no murmurs, no S3.  ABD: Soft non tender. Bowel sounds normal.  Ext: No edema  MS: decreased  ROM spine, shoulders, hips and knees.  Skin: Intact, no ulcerations or rash noted.  Psych: Good eye contact, anxious. Memory impaired, gets confused,   anxious and  depressed appearing.Cries easily and is tearful, but better than on previous occasions  CNS: CN 2-12 intact, decreased power and sensatio in left upper ext with muscle wasting      Assessment & Plan:  HYPERTENSION Uncontrolled, medication adjusted and increased DASH diet and commitment to daily physical activity for a minimum of 30 minutes discussed and encouraged, as a part of hypertension management. The importance of attaining a healthy weight is also discussed.   Obstructive sleep apnea Difficulty using mask due to severe PTSD, hence chronic fatigue and organ damage due to untreated sleep apnea, [facility], both in terms of the PTSD and an alternative mask  BACK PAIN WITH RADICULOPATHY Uncontrolled back pain, attempt made to have pt start on a regular schedule of pain med and not mix narcotics, which  is not his intent   BENIGN PROSTATIC HYPERTROPHY, WITH URINARY OBSTRUCTION Improved urinary flow with flomax which he will resume  ERECTILE DYSFUNCTION, NON-ORGANIC ongoing source of frustration, discussed use of a pump through urology, not too keen at this time  Nicotine dependence Unable to set quoit date though he does want to do  so, states nerves will not allow at this time Patient counseled for approximately 5 minutes regarding the health risks of ongoing nicotine use, specifically all types of cancer, heart disease, stroke and respiratory failure. The options available for help with cessation ,the behavioral changes to assist the process, and the option to either gradully reduce usage  Or abruptly stop.is also discussed. Pt is also encouraged to set specific goals in number of cigarettes used daily, as well as to set a quit date.   PTSD Uncontrolled, interferes with sleep recurrent nightmares, and also pt unable to wear CPAP with his PTSD so out of control, will address further at the [place]

## 2013-02-22 NOTE — Assessment & Plan Note (Signed)
Improved urinary flow with flomax which he will resume

## 2013-03-06 ENCOUNTER — Other Ambulatory Visit: Payer: Self-pay | Admitting: Neurosurgery

## 2013-03-06 DIAGNOSIS — M545 Low back pain, unspecified: Secondary | ICD-10-CM

## 2013-03-10 ENCOUNTER — Encounter: Payer: Self-pay | Admitting: Family Medicine

## 2013-03-10 ENCOUNTER — Ambulatory Visit (INDEPENDENT_AMBULATORY_CARE_PROVIDER_SITE_OTHER): Payer: Medicare Other | Admitting: Family Medicine

## 2013-03-10 VITALS — BP 130/80 | HR 89 | Resp 16 | Wt 270.0 lb

## 2013-03-10 DIAGNOSIS — E739 Lactose intolerance, unspecified: Secondary | ICD-10-CM

## 2013-03-10 DIAGNOSIS — F32A Depression, unspecified: Secondary | ICD-10-CM

## 2013-03-10 DIAGNOSIS — M549 Dorsalgia, unspecified: Secondary | ICD-10-CM | POA: Diagnosis not present

## 2013-03-10 DIAGNOSIS — F329 Major depressive disorder, single episode, unspecified: Secondary | ICD-10-CM

## 2013-03-10 DIAGNOSIS — E8881 Metabolic syndrome: Secondary | ICD-10-CM | POA: Diagnosis not present

## 2013-03-10 DIAGNOSIS — E785 Hyperlipidemia, unspecified: Secondary | ICD-10-CM | POA: Diagnosis not present

## 2013-03-10 DIAGNOSIS — I10 Essential (primary) hypertension: Secondary | ICD-10-CM

## 2013-03-10 DIAGNOSIS — Z7189 Other specified counseling: Secondary | ICD-10-CM

## 2013-03-10 DIAGNOSIS — F3289 Other specified depressive episodes: Secondary | ICD-10-CM

## 2013-03-10 DIAGNOSIS — G4733 Obstructive sleep apnea (adult) (pediatric): Secondary | ICD-10-CM

## 2013-03-10 DIAGNOSIS — R7301 Impaired fasting glucose: Secondary | ICD-10-CM

## 2013-03-10 DIAGNOSIS — F4310 Post-traumatic stress disorder, unspecified: Secondary | ICD-10-CM

## 2013-03-10 DIAGNOSIS — E669 Obesity, unspecified: Secondary | ICD-10-CM

## 2013-03-10 DIAGNOSIS — F17200 Nicotine dependence, unspecified, uncomplicated: Secondary | ICD-10-CM | POA: Diagnosis not present

## 2013-03-10 DIAGNOSIS — F528 Other sexual dysfunction not due to a substance or known physiological condition: Secondary | ICD-10-CM

## 2013-03-10 DIAGNOSIS — IMO0002 Reserved for concepts with insufficient information to code with codable children: Secondary | ICD-10-CM

## 2013-03-10 MED ORDER — HYDROCODONE-ACETAMINOPHEN 10-325 MG PO TABS: ORAL_TABLET | ORAL | Status: DC

## 2013-03-10 NOTE — Patient Instructions (Addendum)
F/u first week in June, call if you need me before  Congrats on improved health, continue healthy eating and work on stopping smoking  Sign pain contract today as explained, start 1 hydrocodone at night and 1 gabapentin 100mg  at night please  Blood pressure is excellent  Sign for note from neurosurgeon please  Try and get info to start psychotherapy locally please  Congrats on 7 pound weight loss  Fasting lipid, cmp and hBA1C in June 3 to 5 days before visit  Fall Prevention and Home Safety Falls cause injuries and can affect all age groups. It is possible to prevent falls.  HOW TO PREVENT FALLS  Wear shoes with rubber soles that do not have an opening for your toes.  Keep the inside and outside of your house well lit.  Use night lights throughout your home.  Remove clutter from floors.  Clean up floor spills.  Remove throw rugs or fasten them to the floor with carpet tape.  Do not place electrical cords across pathways.  Put grab bars by your tub, shower, and toilet. Do not use towel bars as grab bars.  Put handrails on both sides of the stairway. Fix loose handrails.  Do not climb on stools or stepladders, if possible.  Do not wax your floors.  Repair uneven or unsafe sidewalks, walkways, or stairs.  Keep items you use a lot within reach.  Be aware of pets.  Keep emergency numbers next to the telephone.  Put smoke detectors in your home and near bedrooms. Ask your doctor what other things you can do to prevent falls. Document Released: 10/14/2008 Document Revised: 06/19/2011 Document Reviewed: 03/20/2011 [facility] Patient Information 2014 [place], [place].

## 2013-03-17 ENCOUNTER — Ambulatory Visit
Admission: RE | Admit: 2013-03-17 | Discharge: 2013-03-17 | Disposition: A | Discharge status: Home / Self Care | Payer: Medicare Other | Source: Ambulatory Visit | Attending: Neurosurgery | Admitting: Neurosurgery

## 2013-03-17 DIAGNOSIS — M545 Low back pain, unspecified: Secondary | ICD-10-CM

## 2013-03-17 DIAGNOSIS — M48061 Spinal stenosis, lumbar region without neurogenic claudication: Secondary | ICD-10-CM | POA: Diagnosis not present

## 2013-03-27 ENCOUNTER — Other Ambulatory Visit: Payer: Self-pay

## 2013-03-27 DIAGNOSIS — M549 Dorsalgia, unspecified: Secondary | ICD-10-CM

## 2013-03-27 MED ORDER — HYDROCODONE-ACETAMINOPHEN 10-325 MG PO TABS: ORAL_TABLET | ORAL | Status: DC

## 2013-04-15 DIAGNOSIS — M545 Low back pain, unspecified: Secondary | ICD-10-CM | POA: Diagnosis not present

## 2013-04-15 DIAGNOSIS — M47817 Spondylosis without myelopathy or radiculopathy, lumbosacral region: Secondary | ICD-10-CM | POA: Diagnosis not present

## 2013-04-15 DIAGNOSIS — M48062 Spinal stenosis, lumbar region with neurogenic claudication: Secondary | ICD-10-CM | POA: Diagnosis not present

## 2013-04-15 DIAGNOSIS — M5137 Other intervertebral disc degeneration, lumbosacral region: Secondary | ICD-10-CM | POA: Diagnosis not present

## 2013-05-03 DIAGNOSIS — Z7189 Other specified counseling: Secondary | ICD-10-CM | POA: Insufficient documentation

## 2013-05-03 NOTE — Assessment & Plan Note (Addendum)
Pt to sign pain contract for once daily hydrocodone and gabapentin at bedtime

## 2013-05-03 NOTE — Assessment & Plan Note (Signed)
Continues to have nightmares which are terrifying, needs more intense therapy and is to seek this locally

## 2013-05-03 NOTE — Assessment & Plan Note (Signed)
Good res[ponse to medication continue same

## 2013-05-03 NOTE — Assessment & Plan Note (Signed)
Unchanged. Patient counseled for approximately 5 minutes regarding the health risks of ongoing nicotine use, specifically all types of cancer, heart disease, stroke and respiratory failure. The options available for help with cessation ,the behavioral changes to assist the process, and the option to either gradully reduce usage  Or abruptly stop.is also discussed. Pt is also encouraged to set specific goals in number of cigarettes used daily, as well as to set a quit date.  

## 2013-05-03 NOTE — Assessment & Plan Note (Signed)
Improved, but still too high, pt to take statin prescribed Hyperlipidemia:Low fat diet discussed and encouraged.  Updated lab needed at/ before next visit.

## 2013-05-03 NOTE — Assessment & Plan Note (Signed)
Reduction in fall risk and safety in the home discussed , and literature  provided on the discharge instruction sheet as well.  

## 2013-05-03 NOTE — Progress Notes (Signed)
Subjective:    Patient ID: [name], male    DOB: [birth year], 68 y.o.   MRN: [id number]  HPI The PT is here for follow up and re-evaluation of chronic medical conditions in particular uncontrolled hypertension, he is also here for medication management and review of any available recent lab and radiology data.  Preventive health is updated, specifically  Cancer screening and Immunization.   He reports improvement somewhat as far as his chronic back pain is concerned, but wants to start getting regular pain management locally.         Review of Systems See HPI Denies recent fever or chills. Denies sinus pressure, nasal congestion, ear pain or sore throat. Denies chest congestion, productive cough or wheezing. Denies chest pains, palpitations and leg swelling Denies abdominal pain, nausea, vomiting,diarrhea or constipation.   Denies dysuria, frequency, hesitancy or incontinence. Denies headaches, seizures, numbness, or tingling. C/o depression due to poor financial circumstance, no income, had to ask his 49 y/o Mom for money which really bothers him. Denies skin break down or rash.        Objective:   Physical Exam BP 130/80  Pulse 89  Resp 16  Wt 270 lb (122.471 kg)  SpO2 98% Patient alert and oriented and in no cardiopulmonary distress.  HEENT: No facial asymmetry, EOMI, no sinus tenderness,  oropharynx pink and moist.  Neck decreased ROM, no jVd, no adenopathy.  Chest: Clear to auscultation bilaterally.Decreased air entry  CVS: S1, S2 no murmurs, no S3.  ABD: Soft non tender. Bowel sounds normal.  Ext: No edema  mS though reduced  ROM spine, adequate in shoulders, hips and knees.  Skin: Intact, no ulcerations or rash noted.  Psych: Good eye contact, normal affect. Memory intact mildly  anxious not  depressed appearing.  CNS: CN 2-12 intact, power, tone and sensation normal throughout.        Assessment & Plan:  HYPERTENSION Controlled, no  change in medication   BACK PAIN WITH RADICULOPATHY Pt to sign pain contract for once daily hydrocodone and gabapentin at bedtime  HYPERLIPIDEMIA Improved, but still too high, pt to take statin prescribed Hyperlipidemia:Low fat diet discussed and encouraged.  Updated lab needed at/ before next visit.   Obstructive sleep apnea ongping difficulty with use of CPAP due to night terrors from PTSD. Pt encouraged strongly to attempt to get a mask which he will use consistently for cardiopulmonary protection  IMPAIRED GLUCOSE TOLERANCE Deteriorated Patient educated about the importance of limiting  Carbohydrate intake , the need to commit to daily physical activity for a minimum of 30 minutes , and to commit weight loss. The fact that changes in all these areas will reduce or eliminate all together the development of diabetes is stressed.     Depression Somewhat improved, but needs psychotherapy, pt to seek some locally if the [facility] will cover ., [facility] therapist currently at [facility]  Metabolic syndrome X The increased risk of cardiovascular disease associated with this diagnosis, and the need to consistently work on lifestyle to change this is discussed. Following  a  heart healthy diet ,commitment to 30 minutes of exercise at least 5 days per week, as well as control of blood sugar and cholesterol , and achieving a healthy weight are all the areas to be addressed .   OBESITY Improved. Pt applauded on succesful weight loss through lifestyle change, and encouraged to continue same. Weight loss goal set for the next several months.   Nicotine dependence Unchanged  Patient counseled for approximately 5 minutes regarding the health risks of ongoing nicotine use, specifically all types of cancer, heart disease, stroke and respiratory failure. The options available for help with cessation ,the behavioral changes to assist the process, and the option to either gradully reduce usage  Or abruptly  stop.is also discussed. Pt is also encouraged to set specific goals in number of cigarettes used daily, as well as to set a quit date.   Encounter for home safety review for injury prevention Reduction in fall risk and safety in the home discussed , and literature  provided on the discharge instruction sheet as well.   ERECTILE DYSFUNCTION, NON-ORGANIC Good res[ponse to medication continue same  PTSD Continues to have nightmares which are terrifying, needs more intense therapy and is to seek this locally

## 2013-05-03 NOTE — Assessment & Plan Note (Signed)
The increased risk of cardiovascular disease associated with this diagnosis, and the need to consistently work on lifestyle to change this is discussed. Following  a  heart healthy diet ,commitment to 30 minutes of exercise at least 5 days per week, as well as control of blood sugar and cholesterol , and achieving a healthy weight are all the areas to be addressed .  

## 2013-05-03 NOTE — Assessment & Plan Note (Signed)
Deteriorated Patient educated about the importance of limiting  Carbohydrate intake , the need to commit to daily physical activity for a minimum of 30 minutes , and to commit weight loss. The fact that changes in all these areas will reduce or eliminate all together the development of diabetes is stressed.    

## 2013-05-03 NOTE — Assessment & Plan Note (Signed)
Somewhat improved, but needs psychotherapy, pt to seek some locally if the [facility] will cover ., sees therapist currently at [facility]

## 2013-05-03 NOTE — Assessment & Plan Note (Signed)
ongping difficulty with use of CPAP due to night terrors from PTSD. Pt encouraged strongly to attempt to get a mask which he will use consistently for cardiopulmonary protection

## 2013-05-03 NOTE — Assessment & Plan Note (Signed)
Improved. Pt applauded on succesful weight loss through lifestyle change, and encouraged to continue same. Weight loss goal set for the next several months.  

## 2013-05-03 NOTE — Assessment & Plan Note (Signed)
Controlled, no change in medication  

## 2013-06-01 ENCOUNTER — Other Ambulatory Visit: Payer: Self-pay | Admitting: Family Medicine

## 2013-06-01 DIAGNOSIS — R7402 Elevation of levels of lactic acid dehydrogenase (LDH): Secondary | ICD-10-CM | POA: Diagnosis not present

## 2013-06-01 DIAGNOSIS — R7301 Impaired fasting glucose: Secondary | ICD-10-CM | POA: Diagnosis not present

## 2013-06-01 DIAGNOSIS — E785 Hyperlipidemia, unspecified: Secondary | ICD-10-CM | POA: Diagnosis not present

## 2013-06-01 DIAGNOSIS — I10 Essential (primary) hypertension: Secondary | ICD-10-CM | POA: Diagnosis not present

## 2013-06-01 DIAGNOSIS — R7989 Other specified abnormal findings of blood chemistry: Secondary | ICD-10-CM | POA: Diagnosis not present

## 2013-06-01 DIAGNOSIS — R7401 Elevation of levels of liver transaminase levels: Secondary | ICD-10-CM | POA: Diagnosis not present

## 2013-06-01 LAB — COMPREHENSIVE METABOLIC PANEL
ALT: 274 U/L — AB (ref 0–53)
AST: 206 U/L — AB (ref 0–37)
Albumin: 3.7 g/dL (ref 3.5–5.2)
Alkaline Phosphatase: 125 U/L — ABNORMAL HIGH (ref 39–117)
BILIRUBIN TOTAL: 0.4 mg/dL (ref 0.2–1.2)
BUN: 10 mg/dL (ref 6–23)
CO2: 22 meq/L (ref 19–32)
Calcium: 9.9 mg/dL (ref 8.4–10.5)
Chloride: 108 mEq/L (ref 96–112)
Creat: 1.2 mg/dL (ref 0.50–1.35)
Glucose, Bld: 85 mg/dL (ref 70–99)
Potassium: 4.2 mEq/L (ref 3.5–5.3)
SODIUM: 135 meq/L (ref 135–145)
TOTAL PROTEIN: 8.2 g/dL (ref 6.0–8.3)

## 2013-06-01 LAB — HEMOGLOBIN A1C
HEMOGLOBIN A1C: 6 % — AB (ref <5.7)
Mean Plasma Glucose: 126 mg/dL — ABNORMAL HIGH (ref <117)

## 2013-06-01 LAB — LIPID PANEL
Cholesterol: 188 mg/dL (ref 0–200)
HDL: 35 mg/dL — ABNORMAL LOW (ref >39)
LDL CALC: 91 mg/dL (ref 0–99)
Total CHOL/HDL Ratio: 5.4 Ratio
Triglycerides: 312 mg/dL — ABNORMAL HIGH (ref <150)
VLDL: 62 mg/dL — ABNORMAL HIGH (ref 0–40)

## 2013-06-02 LAB — HEPATITIS PANEL, ACUTE
HCV Ab: REACTIVE — AB
Hep A IgM: NONREACTIVE
Hep B C IgM: NONREACTIVE
Hepatitis B Surface Ag: NEGATIVE

## 2013-06-03 ENCOUNTER — Ambulatory Visit (INDEPENDENT_AMBULATORY_CARE_PROVIDER_SITE_OTHER): Payer: Medicare Other | Admitting: Family Medicine

## 2013-06-03 ENCOUNTER — Encounter: Payer: Self-pay | Admitting: Family Medicine

## 2013-06-03 VITALS — BP 148/80 | HR 60 | Resp 16 | Wt 265.1 lb

## 2013-06-03 DIAGNOSIS — I10 Essential (primary) hypertension: Secondary | ICD-10-CM

## 2013-06-03 DIAGNOSIS — F17200 Nicotine dependence, unspecified, uncomplicated: Secondary | ICD-10-CM | POA: Diagnosis not present

## 2013-06-03 DIAGNOSIS — R740 Nonspecific elevation of levels of transaminase and lactic acid dehydrogenase [LDH]: Secondary | ICD-10-CM

## 2013-06-03 DIAGNOSIS — F4310 Post-traumatic stress disorder, unspecified: Secondary | ICD-10-CM

## 2013-06-03 DIAGNOSIS — R768 Other specified abnormal immunological findings in serum: Secondary | ICD-10-CM

## 2013-06-03 DIAGNOSIS — M542 Cervicalgia: Secondary | ICD-10-CM

## 2013-06-03 DIAGNOSIS — R109 Unspecified abdominal pain: Secondary | ICD-10-CM

## 2013-06-03 DIAGNOSIS — E739 Lactose intolerance, unspecified: Secondary | ICD-10-CM

## 2013-06-03 DIAGNOSIS — R110 Nausea: Secondary | ICD-10-CM

## 2013-06-03 DIAGNOSIS — IMO0002 Reserved for concepts with insufficient information to code with codable children: Secondary | ICD-10-CM

## 2013-06-03 DIAGNOSIS — R894 Abnormal immunological findings in specimens from other organs, systems and tissues: Secondary | ICD-10-CM

## 2013-06-03 DIAGNOSIS — E785 Hyperlipidemia, unspecified: Secondary | ICD-10-CM

## 2013-06-03 DIAGNOSIS — R7689 Other specified abnormal immunological findings in serum: Secondary | ICD-10-CM

## 2013-06-03 DIAGNOSIS — E669 Obesity, unspecified: Secondary | ICD-10-CM

## 2013-06-03 DIAGNOSIS — G4733 Obstructive sleep apnea (adult) (pediatric): Secondary | ICD-10-CM

## 2013-06-03 DIAGNOSIS — R7401 Elevation of levels of liver transaminase levels: Secondary | ICD-10-CM | POA: Diagnosis not present

## 2013-06-03 DIAGNOSIS — R7402 Elevation of levels of lactic acid dehydrogenase (LDH): Secondary | ICD-10-CM

## 2013-06-03 NOTE — Patient Instructions (Addendum)
F/u in 2 month, call if you need  Me before  STOP simvastatin which you were taking for cholesterol  STOP tylenol and tylenol containing products ( except the pain medication used judiciously)  You are referred for an [place] of your abdomen since liver enzymes have increased  It is very important that you take BP medication every day as prescribed.  Please work on cutting back on cigarettes  Please change sleep habits as able, background noise causes more sleep disturbance , and increased nightmares.  Cut back on the stress you are putting on your body as able  Based on your health conditions , it is my opinion that you are unable to be gainfully employed on either a full or part time basis  You Can Quit Smoking If you are ready to quit smoking or are thinking about it, congratulations! You have chosen to help yourself be healthier and live longer! There are lots of different ways to quit smoking. Nicotine gum, nicotine patches, a nicotine inhaler, or nicotine nasal spray can help with physical craving. Hypnosis, support groups, and medicines help break the habit of smoking. TIPS TO GET OFF AND STAY OFF CIGARETTES  Learn to predict your moods. Do not let a bad situation be your excuse to have a cigarette. Some situations in your life might tempt you to have a cigarette.  Ask friends and co-workers not to smoke around you.  Make your home smoke-free.  Never have "just one" cigarette. It leads to wanting another and another. Remind yourself of your decision to quit.  On a card, make a list of your reasons for not smoking. Read it at least the same number of times a day as you have a cigarette. Tell yourself everyday, "I do not want to smoke. I choose not to smoke."  Ask someone at home or work to help you with your plan to quit smoking.  Have something planned after you eat or have a cup of coffee. Take a walk or get other exercise to perk you up. This will help to keep you from  overeating.  Try a relaxation exercise to calm you down and decrease your stress. Remember, you may be tense and nervous the first two weeks after you quit. This will pass.  Find new activities to keep your hands busy. Play with a pen, coin, or rubber band. Doodle or draw things on paper.  Brush your teeth right after eating. This will help cut down the craving for the taste of tobacco after meals. You can try mouthwash too.  Try gum, breath mints, or diet candy to keep something in your mouth. IF YOU SMOKE AND WANT TO QUIT:  Do not stock up on cigarettes. Never buy a carton. Wait until one pack is finished before you buy another.  Never carry cigarettes with you at work or at home.  Keep cigarettes as far away from you as possible. Leave them with someone else.  Never carry matches or a lighter with you.  Ask yourself, "Do I need this cigarette or is this just a reflex?"  Bet with someone that you can quit. Put cigarette money in a piggy bank every morning. If you smoke, you give up the money. If you do not smoke, by the end of the week, you keep the money.  Keep trying. It takes 21 days to change a habit!  Talk to your doctor about using medicines to help you quit. These include nicotine replacement gum, lozenges, or  skin patches. Document Released: 10/14/2008 Document Revised: 03/12/2011 Document Reviewed: 10/14/2008 [facility] Patient Information 2014 [place].   Hepatic function lab draw 2nd week in July, non fasting

## 2013-06-07 DIAGNOSIS — R768 Other specified abnormal immunological findings in serum: Secondary | ICD-10-CM | POA: Insufficient documentation

## 2013-06-07 NOTE — Assessment & Plan Note (Signed)
Uncontrolled, taking medication erratically counseled against this, no med change DASH diet and commitment to daily physical activity for a minimum of 30 minutes discussed and encouraged, as a part of hypertension management. The importance of attaining a healthy weight is also discussed.

## 2013-06-07 NOTE — Assessment & Plan Note (Signed)
Daily recurrent nightmares with poor sleep, depression which remains inadequately treated , with chronic fatigue, low self esteem frustration and depression, unemployable based on mental health as well as physical limitations

## 2013-06-07 NOTE — Assessment & Plan Note (Signed)
Improved with recent epidural

## 2013-06-07 NOTE — Assessment & Plan Note (Signed)
Improved but marked elevattion of liver enzymes, stop statin , rept test in 6 weeks , [place] of liver

## 2013-06-07 NOTE — Assessment & Plan Note (Signed)
Improved Patient educated about the importance of limiting  Carbohydrate intake , the need to commit to daily physical activity for a minimum of 30 minutes , and to commit weight loss. The fact that changes in all these areas will reduce or eliminate all together the development of diabetes is stressed.

## 2013-06-07 NOTE — Assessment & Plan Note (Signed)
Known Hep C positive, but recent marked spike in liver enzymes warrants US abdomen , sttain and any ty;lenol containing products ar also d/c

## 2013-06-07 NOTE — Assessment & Plan Note (Addendum)
Established hep c dx over 15 years ago, may need re eval with ID due to recent spike in enzymes, if there is no recovery /return to [facility] after d/c statin will refer  US abdomen

## 2013-06-07 NOTE — Assessment & Plan Note (Signed)
Still adjusting to current mask and will likely be outfitted for another in the near future

## 2013-06-07 NOTE — Assessment & Plan Note (Signed)
deterioated ongoing use, wants to quit but currently unable Patient counseled for approximately 5 minutes regarding the health risks of ongoing nicotine use, specifically all types of cancer, heart disease, stroke and respiratory failure. The options available for help with cessation ,the behavioral changes to assist the process, and the option to either gradully reduce usage  Or abruptly stop.is also discussed. Pt is also encouraged to set specific goals in number of cigarettes used daily, as well as to set a quit date.

## 2013-06-07 NOTE — Assessment & Plan Note (Signed)
Recent increae in pain with poor control as he has been gardening more often in recent times. Behavior modification to reduce work to a level which he camn more appropriately handle is discussed

## 2013-06-07 NOTE — Assessment & Plan Note (Signed)
Improved. Pt applauded on succesful weight loss through lifestyle change, and encouraged to continue same. Weight loss goal set for the next several months.  

## 2013-06-07 NOTE — Progress Notes (Signed)
Subjective:    Patient ID: [name], male    DOB: 1945/02/03, 68 y.o.   MRN: [id number]  HPI The PT is here for follow up and re-evaluation of chronic medical conditions, medication management and review of any available recent lab and radiology data.  Preventive health is updated, specifically  Cancer screening and Immunization.   Questions or concerns regarding consultations or procedures which the PT has had in the interim are  addressed. Does report improved pain relief with epidural as far as neck is concerned, but recently experiencing increased low back pain as he has been more involved in gardening digging in the yard  Continues to suffer from night terrors with poor sleep and uncontrolled depression making him absolutely unemployable. His concentration is poor and he remains irritable and moody at home , not because he wants to, but he is incapable of other behavior at this time. Struggling with current CPAP machine ansd is being re fitted through the [employer] f[facility]hting for his appropriate compensation from the [place]      Review of Systems See HPI Denies recent fever or chills. Denies sinus pressure, nasal congestion, ear pain or sore throat. Denies chest congestion, productive cough or wheezing. Denies chest pains, palpitations and leg swelling Denies abdominal pain, nausea, vomiting,diarrhea or constipation.   Denies dysuria, frequency, hesitancy or incontinence. Denies skin break down or rash.        Objective:   Physical Exam BP 148/80  Pulse 60  Resp 16  Wt 265 lb 1.9 oz (120.258 kg)  SpO2 96% Patient alert and oriented and in no cardiopulmonary distress.  HEENT: No facial asymmetry, EOMI, no sinus tenderness,  oropharynx pink and moist.  Neck decreased ROM, no JVd, no adenopathy.  Chest: Clear to auscultation bilaterally.Decreased though adeqaute air entry   CVS: S1, S2 no murmurs, no S3.  ABD: Soft non tender.   Ext: No edema  MS: Decreased  ROM  spine, shoulders, hips and knees.  Skin: Intact, no ulcerations or rash noted.  Psych: Poor  eye contact at timest, depressed affect. Memory impaired , both anxious and  depressed appearing.  CNS: CN 2-12 intact, power,  normal throughout.        Assessment & Plan:  HYPERTENSION Uncontrolled, taking medication erratically counseled against this, no med change DASH diet and commitment to daily physical activity for a minimum of 30 minutes discussed and encouraged, as a part of hypertension management. The importance of attaining a healthy weight is also discussed.   Obstructive sleep apnea Still adjusting to current mask and will likely be outfitted for another in the near future  HYPERLIPIDEMIA Improved but marked elevattion of liver enzymes, stop statin , rept test in 6 weeks , [place] of liver  PTSD Daily recurrent nightmares with poor sleep, depression which remains inadequately treated , with chronic fatigue, low self esteem frustration and depression, unemployable based on mental health as well as physical limitations  Transaminitis Known Hep C positive, but recent marked spike in liver enzymes warrants US abdomen , sttain and any ty;lenol containing products ar also d/c  Nicotine dependence deterioated ongoing use, wants to quit but currently unable Patient counseled for approximately 5 minutes regarding the health risks of ongoing nicotine use, specifically all types of cancer, heart disease, stroke and respiratory failure. The options available for help with cessation ,the behavioral changes to assist the process, and the option to either gradully reduce usage  Or abruptly stop.is also discussed. Pt is  also encouraged to set specific goals in number of cigarettes used daily, as well as to set a quit date.   BACK PAIN WITH RADICULOPATHY Recent increae in pain with poor control as he has been gardening more often in recent times. Behavior modification to reduce work to a level  which he camn more appropriately handle is discussed  OBESITY Improved. Pt applauded on succesful weight loss through lifestyle change, and encouraged to continue same. Weight loss goal set for the next several months.   Neck pain on left side Improved with recent epidural  IMPAIRED GLUCOSE TOLERANCE Improved Patient educated about the importance of limiting  Carbohydrate intake , the need to commit to daily physical activity for a minimum of 30 minutes , and to commit weight loss. The fact that changes in all these areas will reduce or eliminate all together the development of diabetes is stressed.

## 2013-06-08 ENCOUNTER — Ambulatory Visit (HOSPITAL_COMMUNITY)
Admission: RE | Admit: 2013-06-08 | Discharge: 2013-06-08 | Disposition: A | Discharge status: Home / Self Care | Payer: Medicare Other | Source: Ambulatory Visit | Attending: Family Medicine | Admitting: Family Medicine

## 2013-06-08 DIAGNOSIS — Q6101 Congenital single renal cyst: Secondary | ICD-10-CM | POA: Insufficient documentation

## 2013-06-08 DIAGNOSIS — IMO0002 Reserved for concepts with insufficient information to code with codable children: Secondary | ICD-10-CM | POA: Diagnosis not present

## 2013-06-08 DIAGNOSIS — R109 Unspecified abdominal pain: Secondary | ICD-10-CM

## 2013-06-08 DIAGNOSIS — N281 Cyst of kidney, acquired: Secondary | ICD-10-CM | POA: Diagnosis not present

## 2013-06-08 DIAGNOSIS — M47817 Spondylosis without myelopathy or radiculopathy, lumbosacral region: Secondary | ICD-10-CM | POA: Diagnosis not present

## 2013-06-08 DIAGNOSIS — R740 Nonspecific elevation of levels of transaminase and lactic acid dehydrogenase [LDH]: Secondary | ICD-10-CM

## 2013-06-08 DIAGNOSIS — M5137 Other intervertebral disc degeneration, lumbosacral region: Secondary | ICD-10-CM | POA: Diagnosis not present

## 2013-06-08 DIAGNOSIS — R110 Nausea: Secondary | ICD-10-CM

## 2013-06-08 DIAGNOSIS — R7401 Elevation of levels of liver transaminase levels: Secondary | ICD-10-CM

## 2013-07-01 ENCOUNTER — Other Ambulatory Visit: Payer: Self-pay

## 2013-07-01 DIAGNOSIS — M549 Dorsalgia, unspecified: Secondary | ICD-10-CM

## 2013-07-01 MED ORDER — HYDROCODONE-ACETAMINOPHEN 10-325 MG PO TABS: ORAL_TABLET | ORAL | Status: DC

## 2013-08-03 ENCOUNTER — Ambulatory Visit: Payer: Medicare Other | Admitting: Family Medicine

## 2013-10-08 DIAGNOSIS — M5116 Intervertebral disc disorders with radiculopathy, lumbar region: Secondary | ICD-10-CM | POA: Diagnosis not present

## 2013-10-08 DIAGNOSIS — M4726 Other spondylosis with radiculopathy, lumbar region: Secondary | ICD-10-CM | POA: Diagnosis not present

## 2013-10-08 DIAGNOSIS — M419 Scoliosis, unspecified: Secondary | ICD-10-CM | POA: Diagnosis not present

## 2013-10-08 DIAGNOSIS — M5416 Radiculopathy, lumbar region: Secondary | ICD-10-CM | POA: Diagnosis not present

## 2013-11-10 ENCOUNTER — Other Ambulatory Visit: Payer: Self-pay

## 2013-11-10 ENCOUNTER — Ambulatory Visit (INDEPENDENT_AMBULATORY_CARE_PROVIDER_SITE_OTHER): Payer: Medicare Other

## 2013-11-10 DIAGNOSIS — Z23 Encounter for immunization: Secondary | ICD-10-CM | POA: Diagnosis not present

## 2013-11-13 DIAGNOSIS — I10 Essential (primary) hypertension: Secondary | ICD-10-CM | POA: Diagnosis not present

## 2013-11-13 DIAGNOSIS — M4726 Other spondylosis with radiculopathy, lumbar region: Secondary | ICD-10-CM | POA: Diagnosis not present

## 2013-11-13 DIAGNOSIS — M4806 Spinal stenosis, lumbar region: Secondary | ICD-10-CM | POA: Diagnosis not present

## 2013-11-13 DIAGNOSIS — M4722 Other spondylosis with radiculopathy, cervical region: Secondary | ICD-10-CM | POA: Diagnosis not present

## 2013-11-13 DIAGNOSIS — M5030 Other cervical disc degeneration, unspecified cervical region: Secondary | ICD-10-CM | POA: Diagnosis not present

## 2013-11-13 DIAGNOSIS — M5136 Other intervertebral disc degeneration, lumbar region: Secondary | ICD-10-CM | POA: Diagnosis not present

## 2013-11-13 DIAGNOSIS — G5622 Lesion of ulnar nerve, left upper limb: Secondary | ICD-10-CM | POA: Diagnosis not present

## 2013-11-13 DIAGNOSIS — Z6833 Body mass index (BMI) 33.0-33.9, adult: Secondary | ICD-10-CM | POA: Diagnosis not present

## 2014-03-31 DIAGNOSIS — M5136 Other intervertebral disc degeneration, lumbar region: Secondary | ICD-10-CM | POA: Diagnosis not present

## 2014-03-31 DIAGNOSIS — M4806 Spinal stenosis, lumbar region: Secondary | ICD-10-CM | POA: Diagnosis not present

## 2014-03-31 DIAGNOSIS — M47816 Spondylosis without myelopathy or radiculopathy, lumbar region: Secondary | ICD-10-CM | POA: Diagnosis not present

## 2014-10-27 DIAGNOSIS — J988 Other specified respiratory disorders: Secondary | ICD-10-CM | POA: Diagnosis not present

## 2015-02-28 ENCOUNTER — Ambulatory Visit (INDEPENDENT_AMBULATORY_CARE_PROVIDER_SITE_OTHER): Payer: Medicare Other | Admitting: Family Medicine

## 2015-02-28 ENCOUNTER — Encounter: Payer: Self-pay | Admitting: Family Medicine

## 2015-02-28 VITALS — BP 140/82 | HR 78 | Resp 18 | Ht 74.0 in | Wt 275.1 lb

## 2015-02-28 DIAGNOSIS — M542 Cervicalgia: Secondary | ICD-10-CM

## 2015-02-28 DIAGNOSIS — E739 Lactose intolerance, unspecified: Secondary | ICD-10-CM

## 2015-02-28 DIAGNOSIS — E669 Obesity, unspecified: Secondary | ICD-10-CM

## 2015-02-28 DIAGNOSIS — E785 Hyperlipidemia, unspecified: Secondary | ICD-10-CM

## 2015-02-28 DIAGNOSIS — R894 Abnormal immunological findings in specimens from other organs, systems and tissues: Secondary | ICD-10-CM

## 2015-02-28 DIAGNOSIS — I10 Essential (primary) hypertension: Secondary | ICD-10-CM

## 2015-02-28 DIAGNOSIS — R7301 Impaired fasting glucose: Secondary | ICD-10-CM

## 2015-02-28 DIAGNOSIS — R768 Other specified abnormal immunological findings in serum: Secondary | ICD-10-CM

## 2015-02-28 MED ORDER — KETOROLAC TROMETHAMINE 60 MG/2ML IM SOLN
60.0000 mg | Freq: Once | INTRAMUSCULAR | Status: AC
  Administered 2015-02-28: 60 mg via INTRAMUSCULAR

## 2015-02-28 MED ORDER — FAMOTIDINE 40 MG PO TABS: 40.0000 mg | ORAL_TABLET | Freq: Every day | ORAL | Status: DC

## 2015-02-28 MED ORDER — METHYLPREDNISOLONE ACETATE 80 MG/ML IJ SUSP
80.0000 mg | Freq: Once | INTRAMUSCULAR | Status: AC
  Administered 2015-02-28: 80 mg via INTRAMUSCULAR

## 2015-02-28 MED ORDER — GABAPENTIN 300 MG PO CAPS: 300.0000 mg | ORAL_CAPSULE | Freq: Three times a day (TID) | ORAL | Status: DC

## 2015-02-28 MED ORDER — PREDNISONE 5 MG (21) PO TBPK: 5.0000 mg | ORAL_TABLET | ORAL | Status: DC

## 2015-02-28 MED ORDER — IBUPROFEN 800 MG PO TABS: 800.0000 mg | ORAL_TABLET | Freq: Three times a day (TID) | ORAL | Status: DC

## 2015-02-28 NOTE — Assessment & Plan Note (Addendum)
Uncontrolled.Toradol and depo medrol administered IM in the office , to be followed by a short course of oral prednisone and NSAIDS.will start gabapentin at bedtime

## 2015-02-28 NOTE — Assessment & Plan Note (Signed)
Marked improvement, no med change DASH diet and commitment to daily physical activity for a minimum of 30 minutes discussed and encouraged, as a part of hypertension management. The importance of attaining a healthy weight is also discussed.  BP/Weight 02/28/2015 06/03/2013 03/10/2013 02/03/2013 01/12/2013 01/09/2013 [id number]  Systolic BP [id number] [id number] 123456 0000000 0000000  Diastolic BP 82 80 80 84 96 99 84  Wt. (Lbs) 275.12 265.12 270 277 271 262 263.04  BMI 35.31 35.95 36.61 37.56 36.75 33.62 35.67      pdated and correct med top be brought to next visit

## 2015-02-28 NOTE — Assessment & Plan Note (Addendum)
Care provided through [place], on drug holiday currently due to adverse reaction to med reportedly, states that viral load was very low  When last checked , has appt in March

## 2015-02-28 NOTE — Assessment & Plan Note (Signed)
Deteriorated. Patient re-educated about  the importance of commitment to a  minimum of 150 minutes of exercise per week.  The importance of healthy food choices with portion control discussed. Encouraged to start a food diary, count calories and to consider  joining a support group. Sample diet sheets offered. Goals set by the patient for the next several months.   Weight /BMI 02/28/2015 06/03/2013 03/10/2013  WEIGHT 275 lb 1.9 oz 265 lb 1.9 oz 270 lb  HEIGHT 6\' 2"  - -  BMI 35.31 kg/m2 35.95 kg/m2 36.61 kg/m2    Current exercise per week 60 minutes.

## 2015-02-28 NOTE — Patient Instructions (Signed)
Annual wellness in 4 month, call if you need me sooner  Fasting lipid, cmp and EGFr and hBa1C in 4 month  CONGRATS on  Quitting cigarettes x 3 month, keep this up!   For neck pain, injections today, ibuprofen, prednisone and pepcid shrt term, and gabapentin long term  In next 2 to 3 4 weeks call if still excessive pain  So treatment can be adjusted as we discussed  EAT LESS  EAT ON REGULAR SCHEDULE, stop at 7pm  WATER is the beverage , and snacks are fresh fruit and veg  Increase activity, gardening will soon start, you WILL lose weight gained! Thanks for choosing [facility], we consider it a privelige to serve you.

## 2015-02-28 NOTE — Progress Notes (Signed)
Subjective:    Patient ID: [name], male    DOB: 05-23-1945, 70 y.o.   MRN: [id number]  HPI   [name]     MRN: [id number]      DOB: Feb 02, 1945   HPI [name] is here for follow up and re-evaluation of chronic medical conditions, medication management .Marland Kitchen  Preventive health is updated, specifically  Cancer screening and Immunization.   Questions or concerns regarding consultations or procedures which the PT has had in the interim are  Addressed.Is being treated  At the [facility] for hepatitis C Had  adverse reactions to hep C treatment  Which is currently being held as a result Increased neck and right upper extremity pain to thumb with some numbness and weakness with grip   ROS Denies recent fever or chills. Denies sinus pressure, nasal congestion, ear pain or sore throat. Denies chest congestion, productive cough or wheezing. Denies chest pains, palpitations and leg swelling Denies abdominal pain, nausea, vomiting,diarrhea or constipation.   Denies dysuria, frequency, hesitancy or incontinence. Denies joint pain, swelling and limitation in mobility. Denies headaches, seizures, numbness, or tingling. Denies depression, anxiety or insomnia. Denies skin break down or rash.   PE  BP 140/82 mmHg  Pulse 78  Resp 18  Ht 6\' 2"  (1.88 m)  Wt 275 lb 1.9 oz (124.794 kg)  BMI 35.31 kg/m2  SpO2 97%  Patient alert and oriented and in no cardiopulmonary distress.  HEENT: No facial asymmetry, EOMI,   oropharynx pink and moist.  Neck decreased ROM  no JVD, no mass.  Chest: Clear to auscultation bilaterally.  CVS: S1, S2 no murmurs, no S3.Regular rate.  ABD: Soft non tender.   Ext: No edema  MS: Adequate ROM spine, shoulders, hips and knees.  Skin: Intact, no ulcerations or rash noted.  Psych: Good eye contact, normal affect. Memory intact not anxious or depressed appearing.  CNS: CN 2-12 intact, power,  normal throughout.no focal deficits noted.   Assessment &  Plan  Neck pain on right side Uncontrolled.Toradol and depo medrol administered IM in the office , to be followed by a short course of oral prednisone and NSAIDS.will start gabapentin at bedtime   Essential hypertension Marked improvement, no med change DASH diet and commitment to daily physical activity for a minimum of 30 minutes discussed and encouraged, as a part of hypertension management. The importance of attaining a healthy weight is also discussed.  BP/Weight 02/28/2015 06/03/2013 03/10/2013 02/03/2013 01/12/2013 01/09/2013 [id number]  Systolic BP [id number] [id number] [id number] 0000000 123456 0000000 0000000  Diastolic BP 82 80 80 84 96 99 84  Wt. (Lbs) 275.12 265.12 270 277 271 262 263.04  BMI 35.31 35.95 36.61 37.56 36.75 33.62 35.67      pdated and correct med top be brought to next visit  OBESITY Deteriorated. Patient re-educated about  the importance of commitment to a  minimum of 150 minutes of exercise per week.  The importance of healthy food choices with portion control discussed. Encouraged to start a food diary, count calories and to consider  joining a support group. Sample diet sheets offered. Goals set by the patient for the next several months.   Weight /BMI 02/28/2015 06/03/2013 03/10/2013  WEIGHT 275 lb 1.9 oz 265 lb 1.9 oz 270 lb  HEIGHT 6\' 2"  - -  BMI 35.31 kg/m2 35.95 kg/m2 36.61 kg/m2    Current exercise per week 60 minutes.   Positive hepatitis C antibody test Care provided through [place], on drug  holiday currently due to adverse reaction to med reportedly, states that viral load was very low  When last checked , has appt in March      Review of Systems     Objective:   Physical Exam        Assessment & Plan:

## 2015-03-04 ENCOUNTER — Telehealth: Payer: Self-pay

## 2015-03-04 MED ORDER — HYDROCODONE-ACETAMINOPHEN 10-325 MG PO TABS: ORAL_TABLET | ORAL | Status: DC

## 2015-03-04 NOTE — Telephone Encounter (Signed)
Appointment scheduled.

## 2015-03-04 NOTE — Telephone Encounter (Signed)
Will write script for 30 day supply, one at bedtime, he needs appt in 4 weeks, if he needs to rake every night as will have to arrange imaging and or neurosurg re eval or pain clinic

## 2015-03-04 NOTE — Telephone Encounter (Signed)
Patient aware and will come by to collect script.  Will schedule appointment for followup.

## 2015-04-05 ENCOUNTER — Ambulatory Visit: Payer: Medicare Other | Admitting: Family Medicine

## 2015-04-05 ENCOUNTER — Encounter: Payer: Self-pay | Admitting: Family Medicine

## 2015-04-05 ENCOUNTER — Ambulatory Visit (INDEPENDENT_AMBULATORY_CARE_PROVIDER_SITE_OTHER): Payer: Medicare Other | Admitting: Family Medicine

## 2015-04-05 VITALS — BP 128/84 | HR 90 | Resp 16 | Ht 74.0 in | Wt 273.0 lb

## 2015-04-05 DIAGNOSIS — M5440 Lumbago with sciatica, unspecified side: Secondary | ICD-10-CM | POA: Diagnosis not present

## 2015-04-05 DIAGNOSIS — I10 Essential (primary) hypertension: Secondary | ICD-10-CM

## 2015-04-05 MED ORDER — FAMOTIDINE 40 MG PO TABS: 40.0000 mg | ORAL_TABLET | Freq: Every day | ORAL | Status: DC

## 2015-04-05 MED ORDER — HYDROCODONE-ACETAMINOPHEN 10-325 MG PO TABS: ORAL_TABLET | ORAL | Status: DC

## 2015-04-05 MED ORDER — METHYLPREDNISOLONE ACETATE 80 MG/ML IJ SUSP
80.0000 mg | Freq: Once | INTRAMUSCULAR | Status: AC
  Administered 2015-04-05: 80 mg via INTRAMUSCULAR

## 2015-04-05 MED ORDER — IBUPROFEN 800 MG PO TABS: 800.0000 mg | ORAL_TABLET | Freq: Three times a day (TID) | ORAL | Status: DC

## 2015-04-05 MED ORDER — KETOROLAC TROMETHAMINE 60 MG/2ML IM SOLN
60.0000 mg | Freq: Once | INTRAMUSCULAR | Status: AC
  Administered 2015-04-05: 60 mg via INTRAMUSCULAR

## 2015-04-05 MED ORDER — GABAPENTIN 300 MG PO CAPS: 300.0000 mg | ORAL_CAPSULE | Freq: Two times a day (BID) | ORAL | Status: DC

## 2015-04-05 MED ORDER — PREDNISONE 5 MG (21) PO TBPK: 5.0000 mg | ORAL_TABLET | ORAL | Status: DC

## 2015-04-05 NOTE — Patient Instructions (Addendum)
Annual wellness in July as before  Call if you need me sooner  Injections today for acute back again  Ibuprofen , prednisone and gabapentin for back pain, also famotidine  TEN only hydrocodone tablets for use since new injury  Thank you  for choosing [facility]. We consider it a privelige to serve you.  Delivering excellent health care in a caring and  compassionate way is our goal.  Partnering with you,  so that together we can achieve this goal is our strategy.

## 2015-04-05 NOTE — Assessment & Plan Note (Addendum)
Acute low back pain radiating to both lower extremities after lifting heavy object Uncontrolled.Toradol and depo medrol administered IM in the office , to be followed by a short course of oral prednisone and NSAIDS. Limited amount of hydrocodone prescribed and pt toi start gabapentin regularly, has established arthritis and disc disease

## 2015-04-10 NOTE — Progress Notes (Signed)
   Subjective:    Patient ID: [name], male    DOB: 1945/10/20, 70 y.o.   MRN: [id number]  HPI 1 week h/o increased back pain radiating to the  extremity, without any new  weakness and numbness in the extremity. Denies incontinence of stool or urine, and there is  no recent history of any  inciting trauma. Started after lifting a plough  Review of Systems See HPI Denies recent fever or chills. Denies sinus pressure, nasal congestion, ear pain or sore throat. Denies chest congestion, productive cough or wheezing. Denies chest pains, palpitations and leg swelling Denies abdominal pain, nausea, vomiting,diarrhea or constipation.   Denies dysuria, frequency, hesitancy or incontinence.  Denies headaches, seizures, numbness, or tingling. Denies depression, anxiety or insomnia. Denies skin break down or rash.        Objective:   Physical Exam BP 128/84 mmHg  Pulse 90  Resp 16  Ht 6\' 2"  (1.88 m)  Wt 273 lb (123.832 kg)  BMI 35.04 kg/m2  SpO2 97% Patient alert and oriented and in no cardiopulmonary distress.Pt in pain  HEENT: No facial asymmetry, EOMI,   oropharynx pink and moist.  Neck supple no JVD, no mass.  Chest: Clear to auscultation bilaterally.  CVS: S1, S2 no murmurs, no S3.Regular rate.  ABD: Soft non tender.   Ext: No edema  MS: Decreased  ROM lumbar  spine, adequate in shoulders, hips and knees.  Skin: Intact, no ulcerations or rash noted.  Psych: Good eye contact, normal affect. Memory intact not anxious or depressed appearing.  CNS: CN 2-12 intact, power,  normal throughout.no focal deficits noted.        Assessment & Plan:  Back ache Acute low back pain radiating to both lower extremities after lifting heavy object Uncontrolled.Toradol and depo medrol administered IM in the office , to be followed by a short course of oral prednisone and NSAIDS. Limited amount of hydrocodone prescribed and pt toi start gabapentin regularly, has established  arthritis and disc disease   Essential hypertension Controlled, no change in medication DASH diet and commitment to daily physical activity for a minimum of 30 minutes discussed and encouraged, as a part of hypertension management. The importance of attaining a healthy weight is also discussed.  BP/Weight 04/05/2015 02/28/2015 06/03/2013 03/10/2013 02/03/2013 [id number]  Systolic BP 0000000 [id number] 123456 [id number] 0000000 123456 0000000  Diastolic BP 84 82 80 80 84 96 99  Wt. (Lbs) 273 275.12 265.12 270 277 271 262  BMI 35.04 35.31 35.95 36.61 37.56 36.75 33.62

## 2015-04-10 NOTE — Assessment & Plan Note (Signed)
Controlled, no change in medication DASH diet and commitment to daily physical activity for a minimum of 30 minutes discussed and encouraged, as a part of hypertension management. The importance of attaining a healthy weight is also discussed.  BP/Weight 04/05/2015 02/28/2015 06/03/2013 03/10/2013 02/03/2013 [id number]  Systolic BP 0000000 [id number] 123456 [id number] 0000000 123456 0000000  Diastolic BP 84 82 80 80 84 96 99  Wt. (Lbs) 273 275.12 265.12 270 277 271 262  BMI 35.04 35.31 35.95 36.61 37.56 36.75 33.62

## 2015-07-06 ENCOUNTER — Encounter: Payer: Medicare Other | Admitting: Family Medicine

## 2015-07-06 ENCOUNTER — Encounter: Payer: Self-pay | Admitting: Family Medicine

## 2015-08-03 ENCOUNTER — Other Ambulatory Visit: Payer: Self-pay | Admitting: Family Medicine

## 2015-08-03 DIAGNOSIS — D539 Nutritional anemia, unspecified: Secondary | ICD-10-CM | POA: Diagnosis not present

## 2015-08-03 DIAGNOSIS — E739 Lactose intolerance, unspecified: Secondary | ICD-10-CM | POA: Diagnosis not present

## 2015-08-03 DIAGNOSIS — R7301 Impaired fasting glucose: Secondary | ICD-10-CM | POA: Diagnosis not present

## 2015-08-03 DIAGNOSIS — E785 Hyperlipidemia, unspecified: Secondary | ICD-10-CM | POA: Diagnosis not present

## 2015-08-03 DIAGNOSIS — E669 Obesity, unspecified: Secondary | ICD-10-CM | POA: Diagnosis not present

## 2015-08-03 DIAGNOSIS — I10 Essential (primary) hypertension: Secondary | ICD-10-CM | POA: Diagnosis not present

## 2015-08-04 ENCOUNTER — Ambulatory Visit (INDEPENDENT_AMBULATORY_CARE_PROVIDER_SITE_OTHER): Payer: Medicare Other | Admitting: Family Medicine

## 2015-08-04 ENCOUNTER — Encounter: Payer: Self-pay | Admitting: Family Medicine

## 2015-08-04 VITALS — BP 138/78 | HR 80 | Resp 16 | Ht 74.0 in | Wt 249.1 lb

## 2015-08-04 DIAGNOSIS — Z1211 Encounter for screening for malignant neoplasm of colon: Secondary | ICD-10-CM

## 2015-08-04 DIAGNOSIS — Z0000 Encounter for general adult medical examination without abnormal findings: Secondary | ICD-10-CM | POA: Diagnosis not present

## 2015-08-04 DIAGNOSIS — Z87891 Personal history of nicotine dependence: Secondary | ICD-10-CM

## 2015-08-04 DIAGNOSIS — F17208 Nicotine dependence, unspecified, with other nicotine-induced disorders: Secondary | ICD-10-CM

## 2015-08-04 LAB — LIPID PANEL
CHOL/HDL RATIO: 5.1 ratio — AB (ref <=5.0)
Cholesterol: 204 mg/dL — ABNORMAL HIGH (ref 125–200)
HDL: 40 mg/dL (ref >=40)
LDL Cholesterol: 132 mg/dL — ABNORMAL HIGH (ref <130)
TRIGLYCERIDES: 158 mg/dL — AB (ref <150)
VLDL: 32 mg/dL — AB (ref <30)

## 2015-08-04 LAB — POC HEMOCCULT BLD/STL (OFFICE/1-CARD/DIAGNOSTIC): FECAL OCCULT BLD: NEGATIVE

## 2015-08-04 LAB — COMPREHENSIVE METABOLIC PANEL
ALT: 10 U/L (ref 9–46)
AST: 19 U/L (ref 10–35)
Albumin: 3.4 g/dL — ABNORMAL LOW (ref 3.6–5.1)
Alkaline Phosphatase: 101 U/L (ref 40–115)
BUN: 16 mg/dL (ref 7–25)
CHLORIDE: 107 mmol/L (ref 98–110)
CO2: 23 mmol/L (ref 20–31)
Calcium: 9.8 mg/dL (ref 8.6–10.3)
Creat: 1.04 mg/dL (ref 0.70–1.18)
GLUCOSE: 90 mg/dL (ref 65–99)
POTASSIUM: 4.7 mmol/L (ref 3.5–5.3)
Sodium: 132 mmol/L — ABNORMAL LOW (ref 135–146)
Total Bilirubin: 0.4 mg/dL (ref 0.2–1.2)
Total Protein: 10.7 g/dL — ABNORMAL HIGH (ref 6.1–8.1)

## 2015-08-04 LAB — HEMOGLOBIN A1C
Hgb A1c MFr Bld: 5.3 % (ref <5.7)
Mean Plasma Glucose: 105 mg/dL

## 2015-08-04 MED ORDER — BUPROPION HCL ER (SR) 150 MG PO TB12: 150.0000 mg | ORAL_TABLET | Freq: Two times a day (BID) | ORAL | 3 refills | Status: DC

## 2015-08-04 MED ORDER — IBUPROFEN 800 MG PO TABS: ORAL_TABLET | ORAL | 0 refills | Status: AC

## 2015-08-04 NOTE — Assessment & Plan Note (Addendum)
Started 1 PPD cigarettes at age 70, smoked up to 2 PPD , has resumed 1 PPD x 1 year, has over 30 pack year Patient counseled for approximately 5 minutes regarding the health risks of ongoing nicotine use, specifically all types of cancer, heart disease, stroke and respiratory failure. The options available for help with cessation ,the behavioral changes to assist the process, and the option to either gradully reduce usage  Or abruptly stop.is also discussed. Pt is also encouraged to set specific goals in number of cigarettes used daily, as well as to set a quit date. Wellbutrin prescribed and to be started  In am , the hope is that he quit in 2 weeks. He does want to quit , states that his breathing is being negatively affected by smoking ,, and that he hates the fact that he has an "addiction"

## 2015-08-04 NOTE — Progress Notes (Signed)
Preventive Screening-Counseling & Management   Patient present here today for a Medicare annual wellness visit. States recently told [facility] and is concerned about this, wants to understand what this means  Current Problems (verified)   Medications Prior to Visit Allergies (verified)   PAST HISTORY  Family History (updated)   Social History Married 52 years, 2 daughters and disabled from PTSD in [employer]. Current smoker    Risk Factors  Current exercise habits: Works in the garden a few days a week    Dietary issues discussed: heart healthy diet, encouraged to limit fried fatty foods    Cardiac risk factors: nicotine use, current and an approximate 50 pack year history  Depression Screen  (Note: if answer to either of the following is "Yes", a more complete depression screening is indicated)   Over the past two weeks, have you felt down, depressed or hopeless? sometimes  Over the past two weeks, have you felt little interest or pleasure in doing things? No  Have you lost interest or pleasure in daily life? No  Do you often feel hopeless? No  Do you cry easily over simple problems? yes  Activities of Daily Living  In your present state of health, do you have any difficulty performing the following activities?  Driving?: No Managing money?: No Feeding yourself?:No Getting from bed to chair?:No Climbing a flight of stairs?: slowly Preparing food and eating?:No Bathing or showering?:No Getting dressed?:No Getting to the toilet?:No Using the toilet?:No Moving around from place to place?: has a cane but rarely uses it   Fall Risk Assessment In the past year have you fallen or had a near fall?:once (lost balance) Are you currently taking any medications that make you dizzy?:No   Hearing Difficulties: No Do you often ask people to speak up or repeat themselves?: sometimes Do you experience ringing or noises in your ears?: does have ringing in his ears Do  you have difficulty understanding soft or whispered voices?: sometimes  Cognitive Testing  Alert? Yes Normal Appearance?Yes  Oriented to person? Yes Place? Yes  Time? Yes  Displays appropriate judgment?Yes  Can read the correct time from a watch face? yes Are you having problems remembering things? sometimes  Advanced Directives have been discussed with the patient?Yes, brochure given  , full code  List the Names of Other Physician/Practitioners you currently use: (updated) Dr Teryl Lucy (infectious disease) Dr Ulyses Jarred (psych)  Dr Tamala Julian (urologist)    Indicate any recent Medical Services you may have received from other than [facility] providers in the past year (date may be approximate).     Medicare Attestation  I have personally reviewed:  The patient's medical and social history  Their use of alcohol, tobacco or illicit drugs  Their current medications and supplements  The patient's functional ability including ADLs,fall risks, home safety risks, cognitive, and hearing and visual impairment  Diet and physical activities  Evidence for depression or mood disorders  The patient's weight, height, BMI, and visual acuity have been recorded in the chart. I have made referrals, counseling, and provided education to the patient based on review of the above and I have provided the patient with a written personalized care plan for preventive services.    Physical Exam BP 138/78   Pulse 80   Resp 16   Ht 6\' 2"  (1.88 m)   Wt 249 lb 1.9 oz (113 kg)   SpO2 98%   BMI 31.99 kg/m   Abdomen: obese, soft ,  non tender, no palpable organomegaly or mass. Normal BS Rectal: no mass, heme negative stool, prostate smooth and firm  Assessment & Plan:  Medicare annual wellness visit, subsequent Annual exam as documented. Counseling done  re healthy lifestyle involving commitment to 150 minutes exercise per week, heart healthy diet, and attaining healthy weight.The importance of adequate sleep also  discussed. Regular seat belt use and home safety, is also discussed. Changes in health habits are decided on by the patient with goals and time frames  set for achieving them. Immunization and cancer screening needs are specifically addressed at this visit.   Nicotine dependence Started 1 PPD cigarettes at age 10, smoked up to 2 PPD , has resumed 1 PPD x 1 year, has over 30 pack year Patient counseled for approximately 5 minutes regarding the health risks of ongoing nicotine use, specifically all types of cancer, heart disease, stroke and respiratory failure. The options available for help with cessation ,the behavioral changes to assist the process, and the option to either gradully reduce usage  Or abruptly stop.is also discussed. Pt is also encouraged to set specific goals in number of cigarettes used daily, as well as to set a quit date. Wellbutrin prescribed and to be started  In am , the hope is that he quit in 2 weeks. He does want to quit , states that his breathing is being negatively affected by smoking ,, and that he hates the fact that he has an "addiction"    History of smoking 30 or more pack years Started smoking at age 89 approximately, and up to 1 PPD by age 58, and as much as 2 PPD , currently smoking 1 PPD for past 1 year, has quit intermittently for short periods over the years, wants to quit, confirms an over 30 pack year smoking history, needs screening low dose chest scans  Anemia Reports being recently told that he is anemic. Rectal exam done at visit, no mass, prostate not enlarged. Stool is heme negative, will add anemia panel to recent labs

## 2015-08-04 NOTE — Patient Instructions (Signed)
F/u in 3 months, call if you need me before  Rectal exam shows nom bloosd in stool  You are referred for chest scan  PLEASE plan to STOP smoking in 2 weeks, as you want to.  Start wellbutrin twice daily to help to stop smoking  Use ibuprofen on avg 4 times per week, if needed for arthritis pain  CONGRATS on normal blood sugar and weight loss, reduce fried and fatty foods  You will be contacteed if additional labs are abnormal  Thank you  for choosing [facility]. We consider it a privelige to serve you.  Delivering excellent health care in a caring and  compassionate way is our goal.  Partnering with you,  so that together we can achieve this goal is our strategy.

## 2015-08-04 NOTE — Assessment & Plan Note (Signed)

## 2015-08-05 LAB — TSH: TSH: 0.96 m[IU]/L (ref 0.40–4.50)

## 2015-08-05 LAB — VITAMIN B12: Vitamin B-12: 391 pg/mL (ref 200–1100)

## 2015-08-05 LAB — PSA, MEDICARE: PSA: 1.33 ng/mL (ref <=4.00)

## 2015-08-06 ENCOUNTER — Encounter: Payer: Self-pay | Admitting: Family Medicine

## 2015-08-06 DIAGNOSIS — D649 Anemia, unspecified: Secondary | ICD-10-CM | POA: Insufficient documentation

## 2015-08-06 LAB — IRON,TIBC AND FERRITIN PANEL
%SAT: 24 % (ref 15–60)
Ferritin: 56 ng/mL (ref 20–380)
IRON: 91 ug/dL (ref 50–180)
TIBC: 386 ug/dL (ref 250–425)

## 2015-08-06 NOTE — Assessment & Plan Note (Signed)
Reports being recently told that he is anemic. Rectal exam done at visit, no mass, prostate not enlarged. Stool is heme negative, will add anemia panel to recent labs

## 2015-08-06 NOTE — Assessment & Plan Note (Signed)
Started smoking at age 70 approximately, and up to 1 PPD by age 64, and as much as 2 PPD , currently smoking 1 PPD for past 1 year, has quit intermittently for short periods over the years, wants to quit, confirms an over 30 pack year smoking history, needs screening low dose chest scans

## 2015-08-11 ENCOUNTER — Ambulatory Visit (HOSPITAL_COMMUNITY)
Admission: RE | Admit: 2015-08-11 | Discharge: 2015-08-11 | Disposition: A | Discharge status: Home / Self Care | Payer: Medicare Other | Source: Ambulatory Visit | Attending: Family Medicine | Admitting: Family Medicine

## 2015-08-11 DIAGNOSIS — F17208 Nicotine dependence, unspecified, with other nicotine-induced disorders: Secondary | ICD-10-CM

## 2015-08-11 DIAGNOSIS — K760 Fatty (change of) liver, not elsewhere classified: Secondary | ICD-10-CM | POA: Diagnosis not present

## 2015-08-11 DIAGNOSIS — Z122 Encounter for screening for malignant neoplasm of respiratory organs: Secondary | ICD-10-CM | POA: Insufficient documentation

## 2015-08-11 DIAGNOSIS — I700 Atherosclerosis of aorta: Secondary | ICD-10-CM | POA: Diagnosis not present

## 2015-08-11 DIAGNOSIS — J439 Emphysema, unspecified: Secondary | ICD-10-CM | POA: Insufficient documentation

## 2015-08-11 DIAGNOSIS — Z87891 Personal history of nicotine dependence: Secondary | ICD-10-CM | POA: Diagnosis not present

## 2015-08-11 DIAGNOSIS — I2510 Atherosclerotic heart disease of native coronary artery without angina pectoris: Secondary | ICD-10-CM | POA: Insufficient documentation

## 2015-08-25 ENCOUNTER — Telehealth: Payer: Self-pay

## 2015-08-25 DIAGNOSIS — R9389 Abnormal findings on diagnostic imaging of other specified body structures: Secondary | ICD-10-CM

## 2015-08-25 NOTE — Telephone Encounter (Signed)
-----   Message from Fayrene Helper, MD sent at 08/14/2015 12:31 AM EDT ----- Pls explain lung scan shows no concerning nodules , bUT appears he has thicening / plaque in arteries in his heart I recommend cardiology referral for  furhter evaluation, to ensure he has no blockages in heart that need to be fixed. In the iunterim work on stopping smoking, and keeping blood pressure, cholesterol and blood sugar normal Pls refer to card in [place] if he agrees, I will sign, dx is abn chest scan with CAD on exam ?? pls ask

## 2015-08-30 ENCOUNTER — Ambulatory Visit (INDEPENDENT_AMBULATORY_CARE_PROVIDER_SITE_OTHER): Payer: Medicare Other | Admitting: Cardiovascular Disease

## 2015-08-30 ENCOUNTER — Encounter: Payer: Self-pay | Admitting: Cardiovascular Disease

## 2015-08-30 VITALS — BP 148/78 | HR 72 | Ht 74.0 in | Wt 254.0 lb

## 2015-08-30 DIAGNOSIS — I2510 Atherosclerotic heart disease of native coronary artery without angina pectoris: Secondary | ICD-10-CM

## 2015-08-30 DIAGNOSIS — R011 Cardiac murmur, unspecified: Secondary | ICD-10-CM | POA: Diagnosis not present

## 2015-08-30 DIAGNOSIS — E785 Hyperlipidemia, unspecified: Secondary | ICD-10-CM

## 2015-08-30 MED ORDER — ATORVASTATIN CALCIUM 20 MG PO TABS: 20.0000 mg | ORAL_TABLET | Freq: Every day | ORAL | 3 refills | Status: DC

## 2015-08-30 NOTE — Progress Notes (Signed)
Cardiology Office Note   Date:  08/30/2015   ID:  [name], [name], [name]  PCP:  Tula Nakayama, MD  Cardiologist:   Jenkins Rouge, MD   No chief complaint on file.     History of Present Illness: [name] is a 70 y.o. male who presents for evaluation of CAD seen on CT.  He was having a lung cancer screening CT on 08/11/15 noted 3V coronary calcification. No lung cancer or nodules noted. Also noted some calcification of AV.  Smoker With HTN and elevated lipids He is retired Company secretary with PTSS on disability.  He denies chest pain Some exertional dyspnea he Ascribes to weight and smoking. Indicates childhood history of CE and murmur with leaky valves     Past Medical History:  Diagnosis Date  . Back pain   . Benign prostatic hypertrophy   . Chronic depression   . DJD (degenerative joint disease) of cervical spine   . ED (erectile dysfunction)   . Gout   . Hematospermia   . HTN (hypertension)   . Hyperlipidemia   . IGT (impaired glucose tolerance)   . Lipoma   . Nicotine addiction   . Obesity   . PTSD (post-traumatic stress disorder)   . Vitamin D deficiency     Past Surgical History:  Procedure Laterality Date  . APPENDECTOMY    . CHOLECYSTECTOMY    . right bunionectomy    . WISDOM TOOTH EXTRACTION       Current Outpatient Prescriptions  Medication Sig Dispense Refill  . amLODipine (NORVASC) 10 MG tablet Take 10 mg by mouth daily.    Marland Kitchen buPROPion (WELLBUTRIN SR) 150 MG 12 hr tablet Take 1 tablet (150 mg total) by mouth 2 (two) times daily. 60 tablet 3  . ibuprofen (ADVIL,MOTRIN) 800 MG tablet One daily as need for pain Limit to 4 days per week 40 tablet 0  . atorvastatin (LIPITOR) 20 MG tablet Take 1 tablet (20 mg total) by mouth daily. 90 tablet 3  . Tamsulosin HCl (FLOMAX) 0.4 MG CAPS Take 1 capsule (0.4 mg total) by mouth daily after supper. 30 capsule 3   No current facility-administered medications for this visit.      Allergies:   Review of patient's allergies indicates no known allergies.    Social History:  The patient  reports that he has been smoking.  He started smoking about 58 years ago. He has a 0.50 pack-year smoking history. He has never used smokeless tobacco. He reports that he does not drink alcohol or use drugs.   Family History:  The patient's family history includes Diabetes in his brother; Heart disease in his mother; Hypertension in his mother.    ROS:  Please see the history of present illness.   Otherwise, review of systems are positive for none.   All other systems are reviewed and negative.    PHYSICAL EXAM: VS:  BP (!) 148/78 (BP Location: Right Arm)   Pulse 72   Ht 6\' 2"  (1.88 m)   Wt 254 lb (115.2 kg)   SpO2 98%   BMI 32.61 kg/m  , BMI Body mass index is 32.61 kg/m. Affect appropriate Obese black male  HEENT: normal Neck supple with no adenopathy JVP normal no bruits no thyromegaly Lungs clear with no wheezing and good diaphragmatic motion Heart:  S1/S2 SEM murmur, no rub, gallop or click PMI normal Abdomen: benighn, BS positve, no tenderness, no AAA no bruit.  No  HSM or HJR Distal pulses intact with no bruits No edema Neuro non-focal Skin warm and dry No muscular weakness    EKG:     Recent Labs: 08/03/2015: ALT 10; BUN 16; Creat 1.04; Potassium 4.7; Sodium 132; TSH 0.96    Lipid Panel    Component Value Date/Time   CHOL 204 (H) 08/03/2015 0851   TRIG 158 (H) 08/03/2015 0851   HDL 40 08/03/2015 0851   CHOLHDL 5.1 (H) 08/03/2015 0851   VLDL 32 (H) 08/03/2015 0851   LDLCALC 132 (H) 08/03/2015 0851   LDLDIRECT 102 (H) 09/07/2009 0933      Wt Readings from Last 3 Encounters:  08/30/15 254 lb (115.2 kg)  08/04/15 249 lb 1.9 oz (113 kg)  04/05/15 273 lb (123.8 kg)      Other studies Reviewed: Additional studies/ records that were reviewed today include: CT DR Moshe Cipro notes .    ASSESSMENT AND PLAN:  1.  CAD:  Seen on CT for lung cancer  screening CRF;s HTN, smoking and lipids f/u Exercise myovue  2. Smoking:  Continue lung cancer screening CT;s counseled on cessation littler motivation 3. HTN:  Well controlled.  Continue current medications and low sodium Dash type diet.   4. Chol:   Lab Results  Component Value Date   LDLCALC 132 (H) 08/03/2015    5. Murmur:  F/u echo sounds like AV sclerosis   Current medicines are reviewed at length with the patient today.  The patient does not have concerns regarding medicines.  The following changes have been made:  no change  Labs/ tests ordered today include: Myovue   Orders Placed This Encounter  Procedures  . NM Myocar Multi W/Spect W/Wall Motion / EF  . Lipid Profile  . Hepatic function panel  . ECHOCARDIOGRAM COMPLETE     Disposition:   FU with [place] PRN      Signed, Jenkins Rouge, MD  08/30/2015 3:12 PM    [facility] [place], [place], [place]  [address] Phone: [phone]; Fax: [phone]

## 2015-08-30 NOTE — Patient Instructions (Signed)
Your physician wants you to follow-up in:  1 year Dr Blima Singer will receive a reminder letter in the mail two months in advance. If you don't receive a letter, please call our office to schedule the follow-up appointment.   START Atorvastatin 20 mg at dinner   Get FASTING lab work in 8 weeks -around Oct 30 th     Your physician has requested that you have an echocardiogram. Echocardiography is a painless test that uses sound waves to create images of your heart. It provides your doctor with information about the size and shape of your heart and how well your heart's chambers and valves are working. This procedure takes approximately one hour. There are no restrictions for this procedure.    Your physician has requested that you have en exercise stress myoview. For further information please visit [email or web address]. Please follow instruction sheet, as given.        Thank you for choosing [facility] !

## 2015-09-09 ENCOUNTER — Inpatient Hospital Stay (HOSPITAL_COMMUNITY): Admission: RE | Admit: 2015-09-09 | Payer: Medicare Other | Source: Ambulatory Visit

## 2015-09-09 ENCOUNTER — Encounter (HOSPITAL_COMMUNITY): Payer: Medicare Other

## 2015-09-09 ENCOUNTER — Ambulatory Visit (HOSPITAL_COMMUNITY): Payer: Medicare Other

## 2015-11-08 ENCOUNTER — Ambulatory Visit: Payer: Medicare Other | Admitting: Family Medicine

## 2016-04-17 DIAGNOSIS — I10 Essential (primary) hypertension: Secondary | ICD-10-CM | POA: Diagnosis not present

## 2016-04-17 DIAGNOSIS — Z6832 Body mass index (BMI) 32.0-32.9, adult: Secondary | ICD-10-CM | POA: Diagnosis not present

## 2016-04-17 DIAGNOSIS — M5136 Other intervertebral disc degeneration, lumbar region: Secondary | ICD-10-CM | POA: Diagnosis not present

## 2016-04-17 DIAGNOSIS — M4726 Other spondylosis with radiculopathy, lumbar region: Secondary | ICD-10-CM | POA: Diagnosis not present

## 2016-04-17 DIAGNOSIS — M48062 Spinal stenosis, lumbar region with neurogenic claudication: Secondary | ICD-10-CM | POA: Diagnosis not present

## 2016-04-17 DIAGNOSIS — M5416 Radiculopathy, lumbar region: Secondary | ICD-10-CM | POA: Diagnosis not present

## 2016-04-17 DIAGNOSIS — M546 Pain in thoracic spine: Secondary | ICD-10-CM | POA: Diagnosis not present

## 2016-07-01 DIAGNOSIS — C801 Malignant (primary) neoplasm, unspecified: Secondary | ICD-10-CM

## 2016-07-01 DIAGNOSIS — C9000 Multiple myeloma not having achieved remission: Secondary | ICD-10-CM

## 2016-07-01 HISTORY — DX: Multiple myeloma not having achieved remission: C90.00

## 2016-07-01 HISTORY — DX: Malignant (primary) neoplasm, unspecified: C80.1

## 2016-07-26 ENCOUNTER — Telehealth: Payer: Self-pay

## 2016-07-26 NOTE — Telephone Encounter (Signed)
Called pt and left vm to reschedule Medicare Annual Wellness Visit. -nr

## 2016-08-01 ENCOUNTER — Ambulatory Visit (INDEPENDENT_AMBULATORY_CARE_PROVIDER_SITE_OTHER): Payer: Medicare Other | Admitting: Family Medicine

## 2016-08-01 ENCOUNTER — Encounter: Payer: Self-pay | Admitting: Family Medicine

## 2016-08-01 VITALS — BP 130/80 | HR 86 | Temp 98.2°F | Resp 16 | Ht 73.5 in | Wt 238.8 lb

## 2016-08-01 DIAGNOSIS — E785 Hyperlipidemia, unspecified: Secondary | ICD-10-CM | POA: Diagnosis not present

## 2016-08-01 DIAGNOSIS — E8881 Metabolic syndrome: Secondary | ICD-10-CM

## 2016-08-01 DIAGNOSIS — I2510 Atherosclerotic heart disease of native coronary artery without angina pectoris: Secondary | ICD-10-CM | POA: Diagnosis not present

## 2016-08-01 DIAGNOSIS — E559 Vitamin D deficiency, unspecified: Secondary | ICD-10-CM

## 2016-08-01 DIAGNOSIS — F17218 Nicotine dependence, cigarettes, with other nicotine-induced disorders: Secondary | ICD-10-CM

## 2016-08-01 DIAGNOSIS — Z125 Encounter for screening for malignant neoplasm of prostate: Secondary | ICD-10-CM

## 2016-08-01 DIAGNOSIS — I10 Essential (primary) hypertension: Secondary | ICD-10-CM

## 2016-08-01 NOTE — Patient Instructions (Addendum)
F/u in 4.5 month, call if you need me before  You are referred for Chest scan  You are being referred for cardiology f/u since you did not get the test  Last  Year  Please cut back on cigarettes and quit  Call if you want to see local oncologist for 2nd opinion  Blood pressure is good     Steps to Quit Smoking Smoking tobacco can be bad for your health. It can also affect almost every organ in your body. Smoking puts you and people around you at risk for many serious long-lasting (chronic) diseases. Quitting smoking is hard, but it is one of the best things that you can do for your health. It is never too late to quit. What are the benefits of quitting smoking? When you quit smoking, you lower your risk for getting serious diseases and conditions. They can include:  Lung cancer or lung disease.  Heart disease.  Stroke.  Heart attack.  Not being able to have children (infertility).  Weak bones (osteoporosis) and broken bones (fractures).  If you have coughing, wheezing, and shortness of breath, those symptoms may get better when you quit. You may also get sick less often. If you are pregnant, quitting smoking can help to lower your chances of having a baby of low birth weight. What can I do to help me quit smoking? Talk with your doctor about what can help you quit smoking. Some things you can do (strategies) include:  Quitting smoking totally, instead of slowly cutting back how much you smoke over a period of time.  Going to in-person counseling. You are more likely to quit if you go to many counseling sessions.  Using resources and support systems, such as: ? Database administrator with a Social worker. ? Phone quitlines. ? Careers information officer. ? Support groups or group counseling. ? Text messaging programs. ? Mobile phone apps or applications.  Taking medicines. Some of these medicines may have nicotine in them. If you are pregnant or breastfeeding, do not take any  medicines to quit smoking unless your doctor says it is okay. Talk with your doctor about counseling or other things that can help you.  Talk with your doctor about using more than one strategy at the same time, such as taking medicines while you are also going to in-person counseling. This can help make quitting easier. What things can I do to make it easier to quit? Quitting smoking might feel very hard at first, but there is a lot that you can do to make it easier. Take these steps:  Talk to your family and friends. Ask them to support and encourage you.  Call phone quitlines, reach out to support groups, or work with a Social worker.  Ask people who smoke to not smoke around you.  Avoid places that make you want (trigger) to smoke, such as: ? Bars. ? Parties. ? Smoke-break areas at work.  Spend time with people who do not smoke.  Lower the stress in your life. Stress can make you want to smoke. Try these things to help your stress: ? Getting regular exercise. ? Deep-breathing exercises. ? Yoga. ? Meditating. ? Doing a body scan. To do this, close your eyes, focus on one area of your body at a time from head to toe, and notice which parts of your body are tense. Try to relax the muscles in those areas.  Download or buy apps on your mobile phone or tablet that can help you stick  to your quit plan. There are many free apps, such as QuitGuide from the [employer] Office manager for Disease Control and Prevention). You can find more support from smokefree.gov and other websites.  This information is not intended to replace advice given to you by your health care provider. Make sure you discuss any questions you have with your health care provider. Document Released: 10/14/2008 Document Revised: 08/16/2015 Document Reviewed: 05/04/2014 Elsevier Interactive Patient Education  2018 [employer].

## 2016-08-02 DIAGNOSIS — C9000 Multiple myeloma not having achieved remission: Secondary | ICD-10-CM | POA: Insufficient documentation

## 2016-08-02 DIAGNOSIS — I2510 Atherosclerotic heart disease of native coronary artery without angina pectoris: Secondary | ICD-10-CM | POA: Insufficient documentation

## 2016-08-02 NOTE — Assessment & Plan Note (Signed)
Improved as far as symptoms are concerned , uses  Ibuprofen sparingly

## 2016-08-02 NOTE — Assessment & Plan Note (Addendum)
Hyperlipidemia:Low fat diet discussed and encouraged.   Lipid Panel  Lab Results  Component Value Date   CHOL 204 (H) 08/03/2015   HDL 40 08/03/2015   LDLCALC 132 (H) 08/03/2015   LDLDIRECT 102 (H) 09/07/2009   TRIG 158 (H) 08/03/2015   CHOLHDL 5.1 (H) 08/03/2015     Updated lab needed

## 2016-08-02 NOTE — Assessment & Plan Note (Signed)
Improved. Currently uncertain how much of this is related to illness

## 2016-08-02 NOTE — Assessment & Plan Note (Signed)
Refer to cardiology for evaluation of CAD, incomplete testing in 2017, pt agrreeable

## 2016-08-02 NOTE — Assessment & Plan Note (Signed)
Controlled, no change in medication DASH diet and commitment to daily physical activity for a minimum of 30 minutes discussed and encouraged, as a part of hypertension management. The importance of attaining a healthy weight is also discussed.  BP/Weight 08/01/2016 08/30/2015 08/04/2015 04/05/2015 02/28/2015 06/03/2013 7/79/3903  Systolic BP 009 233 007 622 633 354 562  Diastolic BP 80 78 78 84 82 80 80  Wt. (Lbs) 238.75 254 249.12 273 275.12 265.12 270  BMI 31.07 32.61 31.99 35.04 35.31 35.95 36.61

## 2016-08-02 NOTE — Assessment & Plan Note (Signed)

## 2016-08-02 NOTE — Progress Notes (Signed)
KARO ROG     MRN: [id number]      DOB: 08-30-1945   HPI Mr. Granja is here to discuss recent diagnosis  of multiple myeloma. He is in with his wife, states they were told approx 2 weeks ago, but there is a lot of confusion, from the initial consultation per their report. States the were told life expectancy may be less than 6 months to years Mr [place] is clear that he does not want to be a burden and live like a vegetable. Documentation from the [place] establish the dx   couple is considering a 2nd opinion and he has an appt back art the [place] in the morning. I respectfully advise that the ask all questions  To the Specialist treating the illness as they are expert in the field and that 2nd opinions are always welcomed when there are questions in the family/ patient's minds, but take it one step at a time and call back to let me know how hey feel after tomorrow's visit. The thought of a rep tbone biopsy is daunting to him Record review reveals gaps in follow up, including need for screening lung scan and myocardial perfusion test, states he wants these done later in August as well as basic labs Right now the focus is on fishing and wrapping his mind around the new dx of multiple myeloma  ROS See HPI  Denies recent fever or chills. Denies sinus pressure, nasal congestion, ear pain or sore throat. Denies chest congestion, productive cough or wheezing. Denies chest pains, palpitations and leg swelling Denies abdominal pain, nausea, vomiting,diarrhea or constipation.   Denies dysuria, frequency, hesitancy or incontinence. Denies uncontrolled  joint pain, swelling and limitation in mobility. Denies headaches, seizures, numbness, or tingling. Denies depression, does have some anxiety with ne dx , denies significant  insomnia. Denies skin break down or rash.   PE  BP 130/80   Pulse 86   Temp 98.2 F (36.8 C) (Other (Comment))   Resp 16   Ht 6' 1.5" (1.867 m)   Wt 238 lb 12 oz (108.3  kg)   SpO2 98%   BMI 31.07 kg/m   Patient alert and oriented and in no cardiopulmonary distress.  HEENT: No facial asymmetry, EOMI,   oropharynx pink and moist.  Neck supple no JVD, no mass.  Chest: Clear to auscultation bilaterally.Decreased air entry bilaterally  CVS: S1, S2 no murmurs, no S3.Regular rate.  ABD: Soft non tender.   Ext: No edema  MS: Reduced ROM  lumbar  spine, adequate in shoulders, hips and knees.  Skin: Intact, no ulcerations or rash noted.  Psych: Good eye contact, normal affect. Memory intact not anxious or depressed appearing.  CNS: CN 2-12 intact, power,  normal throughout.no focal deficits noted.   Assessment & Plan  Essential hypertension Controlled, no change in medication DASH diet and commitment to daily physical activity for a minimum of 30 minutes discussed and encouraged, as a part of hypertension management. The importance of attaining a healthy weight is also discussed.  BP/Weight 08/01/2016 08/30/2015 08/04/2015 04/05/2015 02/28/2015 06/03/2013 9/83/3825  Systolic BP 053 976 734 193 790 240 973  Diastolic BP 80 78 78 84 82 80 80  Wt. (Lbs) 238.75 254 249.12 273 275.12 265.12 270  BMI 31.07 32.61 31.99 35.04 35.31 35.95 36.61       Nicotine dependence Patient is asked and  confirms current  Nicotine use.  Five to seven minutes of time is spent in counseling  the patient of the need to quit smoking  Advice to quit is delivered clearly specifically in reducing the risk of developing heart disease, having a stroke, or of developing all types of cancer, especially lung and oral cancer. Improvement in breathing and exercise tolerance and quality of life is also discussed, as is the economic benefit.  Assessment of willingness to quit or to make an attempt to quit is made and documented  Assistance in quit attempt is made with several and varied options presented, based on patient's desire and need. These include  literature, local classes  available, 1800 QUIT NOW number, OTC and prescription medication.  The GOAL to be NICOTINE FREE is re emphasized.  The patient has set a personal goal of either reduction or discontinuation and follow up is arranged between 6 an 16 weeks.    Hyperlipemia Hyperlipidemia:Low fat diet discussed and encouraged.   Lipid Panel  Lab Results  Component Value Date   CHOL 204 (H) 08/03/2015   HDL 40 08/03/2015   LDLCALC 132 (H) 08/03/2015   LDLDIRECT 102 (H) 09/07/2009   TRIG 158 (H) 08/03/2015   CHOLHDL 5.1 (H) 08/03/2015     Updated lab needed   ARTHRITIS Improved as far as symptoms are concerned , uses  Ibuprofen sparingly  OBESITY Improved. Currently uncertain how much of this is related to illness   CAD in native artery Refer to cardiology for evaluation of CAD, incomplete testing in 2017, pt agrreeable

## 2016-08-06 ENCOUNTER — Ambulatory Visit: Payer: Medicare Other

## 2016-08-13 ENCOUNTER — Ambulatory Visit (HOSPITAL_COMMUNITY): Payer: Medicare Other

## 2016-08-14 ENCOUNTER — Ambulatory Visit (HOSPITAL_COMMUNITY)
Admission: RE | Admit: 2016-08-14 | Discharge: 2016-08-14 | Disposition: A | Discharge status: Home / Self Care | Payer: Medicare Other | Source: Ambulatory Visit | Attending: Family Medicine | Admitting: Family Medicine

## 2016-08-14 DIAGNOSIS — I289 Disease of pulmonary vessels, unspecified: Secondary | ICD-10-CM | POA: Insufficient documentation

## 2016-08-14 DIAGNOSIS — F17218 Nicotine dependence, cigarettes, with other nicotine-induced disorders: Secondary | ICD-10-CM | POA: Diagnosis not present

## 2016-08-14 DIAGNOSIS — Z87891 Personal history of nicotine dependence: Secondary | ICD-10-CM | POA: Diagnosis not present

## 2016-08-14 DIAGNOSIS — I7789 Other specified disorders of arteries and arterioles: Secondary | ICD-10-CM | POA: Diagnosis not present

## 2016-08-14 DIAGNOSIS — I2510 Atherosclerotic heart disease of native coronary artery without angina pectoris: Secondary | ICD-10-CM | POA: Diagnosis not present

## 2016-08-14 DIAGNOSIS — I700 Atherosclerosis of aorta: Secondary | ICD-10-CM | POA: Insufficient documentation

## 2016-08-14 DIAGNOSIS — C9000 Multiple myeloma not having achieved remission: Secondary | ICD-10-CM | POA: Diagnosis not present

## 2016-08-14 NOTE — Progress Notes (Signed)
Cardiology Office Note   Date:  08/15/2016   ID:  [name], DOB 05-12-1945, MRN [id number]  PCP:  Fayrene Helper, MD  Cardiologist:   Jenkins Rouge, MD   No chief complaint on file.     History of Present Illness: [name] is a 71 y.o. male who presents for evaluation of CAD. Referred by Dr Moshe Cipro. CRF;s include smoking, HTN and elevated lipids. Reviewed primary office notes Ordered lung cancer screening CT for smoking. Non lung findings included esophageal dysmotility, dilated pulmonary artery coronary atherosclerosis, aortic atherosclerosis. Lungs with emphysema and less than 3 mm nodules Recent diagnosis of multiple myeloma.   Retired Company secretary on disability for PTSS was supposed to have exercise myovue back in August 2017 but never scheduled By patient.  He denies SSCP active in his garden Seems unsettled about his myeloma diagnosis and lack of certainty with prognosis  Past Medical History:  Diagnosis Date  . Back pain   . Benign prostatic hypertrophy   . Chronic depression   . DJD (degenerative joint disease) of cervical spine   . ED (erectile dysfunction)   . Gout   . Hematospermia   . HTN (hypertension)   . Hyperlipidemia   . IGT (impaired glucose tolerance)   . Lipoma   . Nicotine addiction   . Obesity   . PTSD (post-traumatic stress disorder)   . Vitamin D deficiency     Past Surgical History:  Procedure Laterality Date  . APPENDECTOMY    . CHOLECYSTECTOMY    . right bunionectomy    . WISDOM TOOTH EXTRACTION       Current Outpatient Prescriptions  Medication Sig Dispense Refill  . ALLOPURINOL PO Take by mouth.    Marland Kitchen amLODipine (NORVASC) 10 MG tablet Take 10 mg by mouth daily.    Marland Kitchen aspirin EC 81 MG tablet Take 81 mg by mouth daily. Takes occasionally    . ibuprofen (ADVIL,MOTRIN) 800 MG tablet One daily as need for pain Limit to 4 days per week 40 tablet 0  . Tamsulosin HCl (FLOMAX) 0.4 MG CAPS Take 1 capsule (0.4 mg total) by mouth  daily after supper. 30 capsule 3   No current facility-administered medications for this visit.     Allergies:   Patient has no known allergies.    Social History:  The patient  reports that he has been smoking.  He started smoking about 59 years ago. He has a 0.50 pack-year smoking history. He has never used smokeless tobacco. He reports that he does not drink alcohol or use drugs.   Family History:  The patient's family history includes Diabetes in his brother; Heart disease in his mother; Hypertension in his mother.    ROS:  Please see the history of present illness.   Otherwise, review of systems are positive for none.   All other systems are reviewed and negative.    PHYSICAL EXAM: VS:  BP 130/80 (BP Location: Right Arm)   Pulse 71   Ht _0  (1.88 m)   Wt 243 lb (110.2 kg)   SpO2 98%   BMI 31.20 kg/m  , BMI Body mass index is 31.2 kg/m. Affect appropriate Healthy:  appears stated age 88: normal Neck supple with no adenopathy JVP normal no bruits no thyromegaly Lungs clear with no wheezing and good diaphragmatic motion Heart:  S1/S2 no murmur, no rub, gallop or click PMI normal Abdomen: benighn, BS positve, no tenderness, no AAA no bruit.  No  HSM or HJR Distal pulses intact with no bruits No edema Neuro non-focal Skin warm and dry No muscular weakness    EKG:  SR rate 63 nonspecific ST changes 3,F   Recent Labs: No results found for requested labs within last 8760 hours.    Lipid Panel    Component Value Date/Time   CHOL 204 (H) 08/03/2015 0851   TRIG 158 (H) 08/03/2015 0851   HDL 40 08/03/2015 0851   CHOLHDL 5.1 (H) 08/03/2015 0851   VLDL 32 (H) 08/03/2015 0851   LDLCALC 132 (H) 08/03/2015 0851   LDLDIRECT 102 (H) 09/07/2009 0933      Wt Readings from Last 3 Encounters:  08/15/16 243 lb (110.2 kg)  08/01/16 238 lb 12 oz (108.3 kg)  08/30/15 254 lb (115.2 kg)      Other studies Reviewed: Additional studies/ records that were reviewed  today include: Notes primary labs, ECG and CT scan.    ASSESSMENT AND PLAN:  1.  CAD:  Calcium seen on CT f/u exercise myovue 2. Multiple Myeloma will seek 2nd opinion [facility] not [facility] Causing mostly fatigue 3. HTN Well controlled.  Continue current medications and low sodium Dash type diet.   4. Cholesterol labs ordered by primary not available yet consider adding statin  5. Smoking/COPD lung CT no cancer discussed need to quit f/u primary    Current medicines are reviewed at length with the patient today.  The patient does not have concerns regarding medicines.  The following changes have been made:  no change  Labs/ tests ordered today include: Ex Myovue   Orders Placed This Encounter  Procedures  . NM Myocar Multi W/Spect W/Wall Motion / EF  . EKG 12-Lead     Disposition:   FU with cardiology PRN      Signed, Jenkins Rouge, MD  08/15/2016 3:10 PM    [facility] [place], [place], [place]  [address] Phone: [phone]; Fax: [phone]

## 2016-08-15 ENCOUNTER — Encounter: Payer: Self-pay | Admitting: Family Medicine

## 2016-08-15 ENCOUNTER — Encounter: Payer: Self-pay | Admitting: Cardiovascular Disease

## 2016-08-15 ENCOUNTER — Ambulatory Visit (INDEPENDENT_AMBULATORY_CARE_PROVIDER_SITE_OTHER): Payer: Medicare Other | Admitting: Cardiovascular Disease

## 2016-08-15 VITALS — BP 130/80 | HR 71 | Ht 74.0 in | Wt 243.0 lb

## 2016-08-15 DIAGNOSIS — I25119 Atherosclerotic heart disease of native coronary artery with unspecified angina pectoris: Secondary | ICD-10-CM

## 2016-08-15 DIAGNOSIS — I10 Essential (primary) hypertension: Secondary | ICD-10-CM

## 2016-08-15 NOTE — Patient Instructions (Signed)
Medication Instructions:  Your physician recommends that you continue on your current medications as directed. Please refer to the Current Medication list given to you today.   Labwork: NONE  Testing/Procedures: Your physician has requested that you have en exercise stress myoview. For further information please visit [email or web address]. Please follow instruction sheet, as given.    Follow-Up: Your physician recommends that you schedule a follow-up appointment in: AS NEEDED    Any Other Special Instructions Will Be Listed Below (If Applicable).     If you need a refill on your cardiac medications before your next appointment, please call your pharmacy.

## 2016-08-21 ENCOUNTER — Encounter (HOSPITAL_BASED_OUTPATIENT_CLINIC_OR_DEPARTMENT_OTHER)
Admission: RE | Admit: 2016-08-21 | Discharge: 2016-08-21 | Disposition: A | Discharge status: Home / Self Care | Payer: Medicare Other | Source: Ambulatory Visit | Attending: Cardiovascular Disease | Admitting: Cardiovascular Disease

## 2016-08-21 ENCOUNTER — Encounter (HOSPITAL_COMMUNITY)
Admission: RE | Admit: 2016-08-21 | Discharge: 2016-08-21 | Disposition: A | Discharge status: Home / Self Care | Payer: Medicare Other | Source: Ambulatory Visit | Attending: Cardiovascular Disease | Admitting: Cardiovascular Disease

## 2016-08-21 ENCOUNTER — Encounter (HOSPITAL_COMMUNITY): Payer: Self-pay

## 2016-08-21 DIAGNOSIS — I25119 Atherosclerotic heart disease of native coronary artery with unspecified angina pectoris: Secondary | ICD-10-CM | POA: Diagnosis not present

## 2016-08-21 LAB — NM MYOCAR MULTI W/SPECT W/WALL MOTION / EF
CHL CUP MPHR: 149 {beats}/min
CHL CUP NUCLEAR SDS: 0
CHL CUP NUCLEAR SRS: 0
CHL CUP RESTING HR STRESS: 53 {beats}/min
CSEPEDS: 20 s
CSEPEW: 4.6 METS
CSEPPHR: 123 {beats}/min
Exercise duration (min): 4 min
LV dias vol: 146 mL (ref 62–150)
LV sys vol: 62 mL
Percent HR: 82 %
RATE: 0.32
RPE: 13
SSS: 0
TID: 0.93

## 2016-08-21 MED ORDER — TECHNETIUM TC 99M TETROFOSMIN IV KIT
30.0000 | PACK | Freq: Once | INTRAVENOUS | Status: AC | PRN
  Administered 2016-08-21: 29.9 via INTRAVENOUS

## 2016-08-21 MED ORDER — REGADENOSON 0.4 MG/5ML IV SOLN
INTRAVENOUS | Status: AC
  Filled 2016-08-21: qty 5

## 2016-08-21 MED ORDER — SODIUM CHLORIDE 0.9% FLUSH
INTRAVENOUS | Status: AC
  Administered 2016-08-21: 10 mL via INTRAVENOUS
  Filled 2016-08-21: qty 10

## 2016-08-21 MED ORDER — TECHNETIUM TC 99M TETROFOSMIN IV KIT
10.0000 | PACK | Freq: Once | INTRAVENOUS | Status: AC | PRN
  Administered 2016-08-21: 10.4 via INTRAVENOUS

## 2016-09-17 ENCOUNTER — Ambulatory Visit (INDEPENDENT_AMBULATORY_CARE_PROVIDER_SITE_OTHER): Payer: Medicare Other

## 2016-09-17 VITALS — BP 142/68 | HR 62 | Temp 98.2°F | Ht 74.0 in | Wt 249.0 lb

## 2016-09-17 DIAGNOSIS — Z0000 Encounter for general adult medical examination without abnormal findings: Secondary | ICD-10-CM | POA: Diagnosis not present

## 2016-09-17 NOTE — Progress Notes (Signed)
Subjective:   [name] is a 71 y.o. male who presents for Medicare Annual/Subsequent preventive examination.  Review of Systems:  Cardiac Risk Factors include: advanced age (>31mn, >>23women);dyslipidemia;hypertension;obesity (BMI >30kg/m2);sedentary lifestyle     Objective:    Vitals: BP (!) 142/68   Pulse 62   Temp 98.2 F (36.8 C) (Oral)   Ht '6\' 2"'  (1.88 m)   Wt 249 lb 0.6 oz (113 kg)   SpO2 99%   BMI 31.97 kg/m   Body mass index is 31.97 kg/m.  Tobacco History  Smoking Status  . Former Smoker  . Packs/day: 1.00  . Years: 58.00  . Types: Cigarettes  . Start date: 08/29/1957  . Quit date: 08/17/2016  Smokeless Tobacco  . Never Used    Comment: approx quit date      Counseling given: Not Answered   Past Medical History:  Diagnosis Date  . Back pain   . Benign prostatic hypertrophy   . [place] ([place]   Multiple Myeloma  . Chronic depression   . DJD (degenerative joint disease) of cervical spine   . ED (erectile dysfunction)   . Gout   . Hematospermia   . HTN (hypertension)   . Hyperlipidemia   . IGT (impaired glucose tolerance)   . Lipoma   . Nicotine addiction   . Obesity   . PTSD (post-traumatic stress disorder)   . Vitamin D deficiency    Past Surgical History:  Procedure Laterality Date  . APPENDECTOMY    . CHOLECYSTECTOMY    . right bunionectomy    . WISDOM TOOTH EXTRACTION     Family History  Problem Relation Age of Onset  . Heart disease Mother   . Hypertension Mother   . Colon cancer Father   . Diabetes Brother   . Hypokalemia Daughter    History  Sexual Activity  . Sexual activity: Not on file    Outpatient Encounter Prescriptions as of 09/17/2016  Medication Sig  . ALLOPURINOL PO Take 1 tablet by mouth daily.   .Marland KitchenamLODipine (NORVASC) 10 MG tablet Take 10 mg by mouth daily.  . Aspirin-Salicylamide-Caffeine (BC HEADACHE POWDER PO) Take by mouth as needed.  .Marland Kitchenibuprofen (ADVIL,MOTRIN) 800 MG tablet One daily as need  for pain Limit to 4 days per week  . Tamsulosin HCl (FLOMAX) 0.4 MG CAPS Take 1 capsule (0.4 mg total) by mouth daily after supper.  .Marland Kitchenaspirin EC 81 MG tablet Take 81 mg by mouth daily. Takes occasionally   No facility-administered encounter medications on file as of 09/17/2016.     Activities of Daily Living In your present state of health, do you have any difficulty performing the following activities: 09/17/2016  Hearing? N  Vision? N  Difficulty concentrating or making decisions? N  Walking or climbing stairs? Y  Comment difficulty climbing stairs  Dressing or bathing? N  Doing errands, shopping? N  Preparing Food and eating ? N  Using the Toilet? N  In the past six months, have you accidently leaked urine? Y  Do you have problems with loss of bowel control? N  Managing your Medications? N  Managing your Finances? N  Housekeeping or managing your Housekeeping? N  Some recent data might be hidden    Patient Care Team: SFayrene Helper MD as PCP - General   Assessment:    Exercise Activities and Dietary recommendations Current Exercise Habits: The patient does not participate in regular exercise at present, Exercise limited by:  None identified  Goals    None     Fall Risk Fall Risk  09/17/2016 08/01/2016 08/04/2015 02/28/2015  Falls in the past year? Yes No Yes No  Number falls in past yr: 1 - 1 -  Injury with Fall? No - - -  Comment reveals patient accidentally stumbled - - -  Follow up Falls evaluation completed - - -   Depression Screen PHQ 2/9 Scores 09/17/2016 08/01/2016 02/28/2015 01/16/2012  PHQ - 2 Score 1 1 0 6  PHQ- 9 Score 4 - - 22    Cognitive Function: Normal   6CIT Screen 09/17/2016  What Year? 0 points  What month? 0 points  What time? 0 points  Count back from 20 0 points  Months in reverse 0 points  Repeat phrase 0 points  Total Score 0    Immunization History  Administered Date(s) Administered  . Influenza Split 10/20/2010  .  Influenza,inj,Quad PF,6+ Mos 01/12/2013, 11/10/2013  . Pneumococcal Polysaccharide-23 10/20/2010  . Td 01/02/2004   Screening Tests Health Maintenance  Topic Date Due  . PNA vac Low Risk Adult (2 of 2 - PCV13) 10/20/2011  . TETANUS/TDAP  01/01/2014  . INFLUENZA VACCINE  08/01/2016  . COLONOSCOPY  01/20/2019  . Hepatitis C Screening  Completed      Plan:   I have personally reviewed and noted the following in the patient's chart:   . Medical and social history . Use of alcohol, tobacco or illicit drugs  . Current medications and supplements . Functional ability and status . Nutritional status . Physical activity . Advanced directives . List of other physicians . Hospitalizations, surgeries, and ER visits in previous 12 months . Vitals . Screenings to include cognitive, depression, and falls . Referrals and appointments  In addition, I have reviewed and discussed with patient certain preventive protocols, quality metrics, and best practice recommendations. A written personalized care plan for preventive services as well as general preventive health recommendations were provided to patient.     Stormy Fabian, LPN  5/32/0233

## 2016-09-17 NOTE — Patient Instructions (Addendum)
[name] , Thank you for taking time to come for your Medicare Wellness Visit. I appreciate your ongoing commitment to your health goals. Please review the following plan we discussed and let me know if I can assist you in the future.   Screening recommendations/referrals: Colonoscopy: Up to date, scheduled for tomorrow Recommended yearly ophthalmology/optometry visit for glaucoma screening and checkup Recommended yearly dental visit for hygiene and checkup  Vaccinations: Influenza vaccine: Due, declines Pneumococcal vaccine: Due for Prevnar 13, declines Tdap vaccine: Due, declines  Shingles vaccine: not recommended due to multiple myeloma diagnosis    Advanced directives: Advance directive discussed with you today. I have provided a copy for you to complete at home and have notarized. Once this is complete please bring a copy in to our office so we can scan it into your chart.  Conditions/risks identified: Obese, recommend starting a routine exercise program at least 3 days a week for 30-45 minutes at a time as tolerated.   Next appointment: Follow up with Dr. Simpson on 12/19/2016 at 8:00am. Follow up in 1 year for your annual wellness visit.   Preventive Care 71 Years and Older, Male Preventive care refers to lifestyle choices and visits with your health care provider that can promote health and wellness. What does preventive care include?  A yearly physical exam. This is also called an annual well check.  Dental exams once or twice a year.  Routine eye exams. Ask your health care provider how often you should have your eyes checked.  Personal lifestyle choices, including:  Daily care of your teeth and gums.  Regular physical activity.  Eating a healthy diet.  Avoiding tobacco and drug use.  Limiting alcohol use.  Practicing safe sex.  Taking low doses of aspirin every day.  Taking vitamin and mineral supplements as recommended by your health care provider. What  happens during an annual well check? The services and screenings done by your health care provider during your annual well check will depend on your age, overall health, lifestyle risk factors, and family history of disease. Counseling  Your health care provider may ask you questions about your:  Alcohol use.  Tobacco use.  Drug use.  Emotional well-being.  Home and relationship well-being.  Sexual activity.  Eating habits.  History of falls.  Memory and ability to understand (cognition).  Work and work environment. Screening  You may have the following tests or measurements:  Height, weight, and BMI.  Blood pressure.  Lipid and cholesterol levels. These may be checked every 5 years, or more frequently if you are over 50 years old.  Skin check.  Lung cancer screening. You may have this screening every year starting at age 55 if you have a 30-pack-year history of smoking and currently smoke or have quit within the past 15 years.  Fecal occult blood test (FOBT) of the stool. You may have this test every year starting at age 50.  Flexible sigmoidoscopy or colonoscopy. You may have a sigmoidoscopy every 5 years or a colonoscopy every 10 years starting at age 50.  Prostate cancer screening. Recommendations will vary depending on your family history and other risks.  Hepatitis C blood test.  Hepatitis B blood test.  Sexually transmitted disease (STD) testing.  Diabetes screening. This is done by checking your blood sugar (glucose) after you have not eaten for a while (fasting). You may have this done every 1-3 years.  Abdominal aortic aneurysm (AAA) screening. You may need this if you are   a current or former smoker.  Osteoporosis. You may be screened starting at age 75 if you are at high risk. Talk with your health care provider about your test results, treatment options, and if necessary, the need for more tests. Vaccines  Your health care provider may recommend  certain vaccines, such as:  Influenza vaccine. This is recommended every year.  Tetanus, diphtheria, and acellular pertussis (Tdap, Td) vaccine. You may need a Td booster every 10 years.  Zoster vaccine. You may need this after age 93.  Pneumococcal 13-valent conjugate (PCV13) vaccine. One dose is recommended after age 56.  Pneumococcal polysaccharide (PPSV23) vaccine. One dose is recommended after age 8. Talk to your health care provider about which screenings and vaccines you need and how often you need them. This information is not intended to replace advice given to you by your health care provider. Make sure you discuss any questions you have with your health care provider. Document Released: 01/14/2015 Document Revised: 09/07/2015 Document Reviewed: 10/19/2014 Elsevier Interactive Patient Education  2017 [place] Prevention in the Home Falls can cause injuries. They can happen to people of all ages. There are many things you can do to make your home safe and to help prevent falls. What can I do on the outside of my home?  Regularly fix the edges of walkways and driveways and fix any cracks.  Remove anything that might make you trip as you walk through a door, such as a raised step or threshold.  Trim any bushes or trees on the path to your home.  Use bright outdoor lighting.  Clear any walking paths of anything that might make someone trip, such as rocks or tools.  Regularly check to see if handrails are loose or broken. Make sure that both sides of any steps have handrails.  Any raised decks and porches should have guardrails on the edges.  Have any leaves, snow, or ice cleared regularly.  Use sand or salt on walking paths during winter.  Clean up any spills in your garage right away. This includes oil or grease spills. What can I do in the bathroom?  Use night lights.  Install grab bars by the toilet and in the tub and shower. Do not use towel bars as  grab bars.  Use non-skid mats or decals in the tub or shower.  If you need to sit down in the shower, use a plastic, non-slip stool.  Keep the floor dry. Clean up any water that spills on the floor as soon as it happens.  Remove soap buildup in the tub or shower regularly.  Attach bath mats securely with double-sided non-slip rug tape.  Do not have throw rugs and other things on the floor that can make you trip. What can I do in the bedroom?  Use night lights.  Make sure that you have a light by your bed that is easy to reach.  Do not use any sheets or blankets that are too big for your bed. They should not hang down onto the floor.  Have a firm chair that has side arms. You can use this for support while you get dressed.  Do not have throw rugs and other things on the floor that can make you trip. What can I do in the kitchen?  Clean up any spills right away.  Avoid walking on wet floors.  Keep items that you use a lot in easy-to-reach places.  If you need to reach something  above you, use a strong step stool that has a grab bar.  Keep electrical cords out of the way.  Do not use floor polish or wax that makes floors slippery. If you must use wax, use non-skid floor wax.  Do not have throw rugs and other things on the floor that can make you trip. What can I do with my stairs?  Do not leave any items on the stairs.  Make sure that there are handrails on both sides of the stairs and use them. Fix handrails that are broken or loose. Make sure that handrails are as long as the stairways.  Check any carpeting to make sure that it is firmly attached to the stairs. Fix any carpet that is loose or worn.  Avoid having throw rugs at the top or bottom of the stairs. If you do have throw rugs, attach them to the floor with carpet tape.  Make sure that you have a light switch at the top of the stairs and the bottom of the stairs. If you do not have them, ask someone to add them  for you. What else can I do to help prevent falls?  Wear shoes that:  Do not have high heels.  Have rubber bottoms.  Are comfortable and fit you well.  Are closed at the toe. Do not wear sandals.  If you use a stepladder:  Make sure that it is fully opened. Do not climb a closed stepladder.  Make sure that both sides of the stepladder are locked into place.  Ask someone to hold it for you, if possible.  Clearly mark and make sure that you can see:  Any grab bars or handrails.  First and last steps.  Where the edge of each step is.  Use tools that help you move around (mobility aids) if they are needed. These include:  Canes.  Walkers.  Scooters.  Crutches.  Turn on the lights when you go into a dark area. Replace any light bulbs as soon as they burn out.  Set up your furniture so you have a clear path. Avoid moving your furniture around.  If any of your floors are uneven, fix them.  If there are any pets around you, be aware of where they are.  Review your medicines with your doctor. Some medicines can make you feel dizzy. This can increase your chance of falling. Ask your doctor what other things that you can do to help prevent falls. This information is not intended to replace advice given to you by your health care provider. Make sure you discuss any questions you have with your health care provider. Document Released: 10/14/2008 Document Revised: 05/26/2015 Document Reviewed: 01/22/2014 Elsevier Interactive Patient Education  2017 [employer] Inc[employer]   

## 2016-12-19 ENCOUNTER — Ambulatory Visit: Payer: Medicare Other | Admitting: Family Medicine

## 2017-11-25 ENCOUNTER — Encounter: Payer: Self-pay | Admitting: *Deleted

## 2017-12-17 ENCOUNTER — Encounter: Payer: Self-pay | Admitting: *Deleted

## 2018-01-01 HISTORY — PX: SPINE SURGERY: SHX786

## 2018-01-24 ENCOUNTER — Encounter: Payer: Self-pay | Admitting: Family Medicine

## 2018-03-03 ENCOUNTER — Ambulatory Visit: Payer: Medicare Other

## 2018-03-10 ENCOUNTER — Ambulatory Visit: Payer: Medicare Other

## 2018-03-17 ENCOUNTER — Ambulatory Visit: Payer: Medicare Other

## 2018-04-09 ENCOUNTER — Encounter: Payer: Self-pay | Admitting: Family Medicine

## 2018-04-09 ENCOUNTER — Ambulatory Visit (INDEPENDENT_AMBULATORY_CARE_PROVIDER_SITE_OTHER): Payer: Medicare Other | Admitting: Family Medicine

## 2018-04-09 ENCOUNTER — Other Ambulatory Visit: Payer: Self-pay

## 2018-04-09 VITALS — Ht 73.0 in | Wt 249.0 lb

## 2018-04-09 DIAGNOSIS — F17210 Nicotine dependence, cigarettes, uncomplicated: Secondary | ICD-10-CM

## 2018-04-09 DIAGNOSIS — Z0000 Encounter for general adult medical examination without abnormal findings: Secondary | ICD-10-CM | POA: Diagnosis not present

## 2018-04-09 DIAGNOSIS — Z122 Encounter for screening for malignant neoplasm of respiratory organs: Secondary | ICD-10-CM

## 2018-04-09 NOTE — Patient Instructions (Signed)
Thank you allowing [place] to complete your visit via telemedicine. I appreciate the opportunity to provide you with the care for your health and wellness. Today we discussed: overall health, I have attached preventive information for your review.  [place] YOUR HANDS WELL AND FREQUENTLY. AVOID TOUCHING YOUR FACE, UNLESS YOUR HANDS ARE FRESHLY WASHED.  GET FRESH AIR DAILY. STAY HYDRATED WITH WATER.   It was a pleasure to see you and I look forward to continuing to work together on your health and well-being. Please do not hesitate to call the office if you need care or have questions about your care.  Have a wonderful day and week. With Gratitude, Cherly Beach, DNP, AGNP-BC   [name] , Thank you for taking time to come for your Medicare Wellness Visit. I appreciate your ongoing commitment to your health goals. Please review the following plan we discussed and let me know if I can assist you in the future.   Screening recommendations/referrals: Colonoscopy: Due in 2020 Recommended yearly ophthalmology/optometry visit for glaucoma screening and checkup Recommended yearly dental visit for hygiene and checkup   Preventive Care 22 Years and Older, Male Preventive care refers to lifestyle choices and visits with your health care provider that can promote health and wellness. What does preventive care include?  A yearly physical exam. This is also called an annual well check.  Dental exams once or twice a year.  Routine eye exams. Ask your health care provider how often you should have your eyes checked.  Personal lifestyle choices, including:  Daily care of your teeth and gums.  Regular physical activity.  Eating a healthy diet.  Avoiding tobacco and drug use.  Limiting alcohol use.  Practicing safe sex.  Taking low doses of aspirin every day.  Taking vitamin and mineral supplements as recommended by your health care provider. What happens during an annual well check?  The services and screenings done by your health care provider during your annual well check will depend on your age, overall health, lifestyle risk factors, and family history of disease. Counseling  Your health care provider may ask you questions about your:  Alcohol use.  Tobacco use.  Drug use.  Emotional well-being.  Home and relationship well-being.  Sexual activity.  Eating habits.  History of falls.  Memory and ability to understand (cognition).  Work and work Statistician. Screening  You may have the following tests or measurements:  Height, weight, and BMI.  Blood pressure.  Lipid and cholesterol levels. These may be checked every 5 years, or more frequently if you are over 71 years old.  Skin check.  Lung cancer screening. You may have this screening every year starting at age 55 if you have a 30-pack-year history of smoking and currently smoke or have quit within the past 15 years.  Fecal occult blood test (FOBT) of the stool. You may have this test every year starting at age 43.  Flexible sigmoidoscopy or colonoscopy. You may have a sigmoidoscopy every 5 years or a colonoscopy every 10 years starting at age 7.  Prostate cancer screening. Recommendations will vary depending on your family history and other risks.  Hepatitis C blood test.  Hepatitis B blood test.  Sexually transmitted disease (STD) testing.  Diabetes screening. This is done by checking your blood sugar (glucose) after you have not eaten for a while (fasting). You may have this done every 1-3 years.  Abdominal aortic aneurysm (AAA) screening. You may need this if you are  a current or former smoker.  Osteoporosis. You may be screened starting at age 46 if you are at high risk. Talk with your health care provider about your test results, treatment options, and if necessary, the need for more tests. Vaccines  Your health care provider may recommend certain vaccines, such as:  Influenza  vaccine. This is recommended every year.  Tetanus, diphtheria, and acellular pertussis (Tdap, Td) vaccine. You may need a Td booster every 10 years.  Zoster vaccine. You may need this after age 88.  Pneumococcal 13-valent conjugate (PCV13) vaccine. One dose is recommended after age 11.  Pneumococcal polysaccharide (PPSV23) vaccine. One dose is recommended after age 65. Talk to your health care provider about which screenings and vaccines you need and how often you need them. This information is not intended to replace advice given to you by your health care provider. Make sure you discuss any questions you have with your health care provider. Document Released: 01/14/2015 Document Revised: 09/07/2015 Document Reviewed: 10/19/2014 Elsevier Interactive Patient Education  2017 [place] Prevention in the Home Falls can cause injuries. They can happen to people of all ages. There are many things you can do to make your home safe and to help prevent falls. What can I do on the outside of my home?  Regularly fix the edges of walkways and driveways and fix any cracks.  Remove anything that might make you trip as you walk through a door, such as a raised step or threshold.  Trim any bushes or trees on the path to your home.  Use bright outdoor lighting.  Clear any walking paths of anything that might make someone trip, such as rocks or tools.  Regularly check to see if handrails are loose or broken. Make sure that both sides of any steps have handrails.  Any raised decks and porches should have guardrails on the edges.  Have any leaves, snow, or ice cleared regularly.  Use sand or salt on walking paths during winter.  Clean up any spills in your garage right away. This includes oil or grease spills. What can I do in the bathroom?  Use night lights.  Install grab bars by the toilet and in the tub and shower. Do not use towel bars as grab bars.  Use non-skid mats or decals  in the tub or shower.  If you need to sit down in the shower, use a plastic, non-slip stool.  Keep the floor dry. Clean up any water that spills on the floor as soon as it happens.  Remove soap buildup in the tub or shower regularly.  Attach bath mats securely with double-sided non-slip rug tape.  Do not have throw rugs and other things on the floor that can make you trip. What can I do in the bedroom?  Use night lights.  Make sure that you have a light by your bed that is easy to reach.  Do not use any sheets or blankets that are too big for your bed. They should not hang down onto the floor.  Have a firm chair that has side arms. You can use this for support while you get dressed.  Do not have throw rugs and other things on the floor that can make you trip. What can I do in the kitchen?  Clean up any spills right away.  Avoid walking on wet floors.  Keep items that you use a lot in easy-to-reach places.  If you need to reach something  above you, use a strong step stool that has a grab bar.  Keep electrical cords out of the way.  Do not use floor polish or wax that makes floors slippery. If you must use wax, use non-skid floor wax.  Do not have throw rugs and other things on the floor that can make you trip. What can I do with my stairs?  Do not leave any items on the stairs.  Make sure that there are handrails on both sides of the stairs and use them. Fix handrails that are broken or loose. Make sure that handrails are as long as the stairways.  Check any carpeting to make sure that it is firmly attached to the stairs. Fix any carpet that is loose or worn.  Avoid having throw rugs at the top or bottom of the stairs. If you do have throw rugs, attach them to the floor with carpet tape.  Make sure that you have a light switch at the top of the stairs and the bottom of the stairs. If you do not have them, ask someone to add them for you. What else can I do to help  prevent falls?  Wear shoes that:  Do not have high heels.  Have rubber bottoms.  Are comfortable and fit you well.  Are closed at the toe. Do not wear sandals.  If you use a stepladder:  Make sure that it is fully opened. Do not climb a closed stepladder.  Make sure that both sides of the stepladder are locked into place.  Ask someone to hold it for you, if possible.  Clearly mark and make sure that you can see:  Any grab bars or handrails.  First and last steps.  Where the edge of each step is.  Use tools that help you move around (mobility aids) if they are needed. These include:  Canes.  Walkers.  Scooters.  Crutches.  Turn on the lights when you go into a dark area. Replace any light bulbs as soon as they burn out.  Set up your furniture so you have a clear path. Avoid moving your furniture around.  If any of your floors are uneven, fix them.  If there are any pets around you, be aware of where they are.  Review your medicines with your doctor. Some medicines can make you feel dizzy. This can increase your chance of falling. Ask your doctor what other things that you can do to help prevent falls. This information is not intended to replace advice given to you by your health care provider. Make sure you discuss any questions you have with your health care provider. Document Released: 10/14/2008 Document Revised: 05/26/2015 Document Reviewed: 01/22/2014 Elsevier Interactive Patient Education  2017 [employer].

## 2018-04-09 NOTE — Addendum Note (Signed)
Addended by: Eual Fines on: 04/09/2018 09:25 AM   Modules accepted: Orders

## 2018-04-09 NOTE — Progress Notes (Addendum)
Subjective:   [name] is a 73 y.o. male who presents for Medicare Annual/Subsequent preventive examination.  Location of Patient: Home Location of Provider: Telehealth Consent was obtain for visit to be over via telehealth. A  telemedicine application was used and I verified that I am speaking with the correct person using two identifiers.    Review of Systems:   Cardiac Risk Factors include: advanced age (>8mn, >>35women);sedentary lifestyle;male gender;hypertension     Objective:    Vitals: Ht '6\' 1"'  (1.854 m)   Wt 249 lb (112.9 kg)   BMI 32.85 kg/m   Body mass index is 32.85 kg/m.  Advanced Directives 04/09/2018 09/17/2016  Does Patient Have a Medical Advance Directive? No No  Does patient want to make changes to medical advance directive? No - Patient declined -  Would patient like information on creating a medical advance directive? - Yes (MAU/Ambulatory/Procedural Areas - Information given)    Tobacco Social History   Tobacco Use  Smoking Status Former Smoker  . Packs/day: 1.00  . Years: 58.00  . Pack years: 58.00  . Types: Cigarettes  . Start date: 08/29/1957  . Last attempt to quit: 08/17/2016  . Years since quitting: 1.6  Smokeless Tobacco Never Used  Tobacco Comment   approx quit date      Counseling given: Yes Comment: approx quit date    Clinical Intake:  Pre-visit preparation completed: No  Pain : No/denies pain     Diabetes: No  How often do you need to have someone help you when you read instructions, pamphlets, or other written materials from your doctor or pharmacy?: 1 - Never What is the last grade level you completed in school?: 13  Interpreter Needed?: No     Past Medical History:  Diagnosis Date  . Back pain   . Benign prostatic hypertrophy   . [place]   Multiple Myeloma  . Chronic depression   . DJD (degenerative joint disease) of cervical spine   . ED (erectile dysfunction)   . Gout   .  Hematospermia   . HTN (hypertension)   . Hyperlipidemia   . IGT (impaired glucose tolerance)   . Lipoma   . [place]  . Nicotine addiction   . Obesity   . PTSD (post-traumatic stress disorder)   . Vitamin D deficiency    Past Surgical History:  Procedure Laterality Date  . APPENDECTOMY    . CHOLECYSTECTOMY    . right bunionectomy    . WISDOM TOOTH EXTRACTION     Family History  Problem Relation Age of Onset  . Heart disease Mother   . Hypertension Mother   . Colon cancer Father   . Diabetes Brother   . Hypokalemia Daughter    Social History   Socioeconomic History  . Marital status: Married    Spouse name: Not on file  . Number of children: Not on file  . Years of education: Not on file  . Highest education level: Not on file  Occupational History  . Not on file  Social Needs  . Financial resource strain: Not on file  . Food insecurity:    Worry: Not on file    Inability: Not on file  . Transportation needs:    Medical: Not on file    Non-medical: Not on file  Tobacco Use  . Smoking status: Former Smoker    Packs/day: 1.00    Years: 58.00    Pack years:  58.00    Types: Cigarettes    Start date: 08/29/1957    Last attempt to quit: 08/17/2016    Years since quitting: 1.6  . Smokeless tobacco: Never Used  . Tobacco comment: approx quit date   Substance and Sexual Activity  . Alcohol use: No    Alcohol/week: 0.0 standard drinks  . Drug use: No  . Sexual activity: Not on file  Lifestyle  . Physical activity:    Days per week: Not on file    Minutes per session: Not on file  . Stress: Not on file  Relationships  . Social connections:    Talks on phone: Not on file    Gets together: Not on file    Attends religious service: Not on file    Active member of club or organization: Not on file    Attends meetings of clubs or organizations: Not on file    Relationship status: Not on file  Other Topics Concern  . Not on file  Social  History Narrative  . Not on file    Outpatient Encounter Medications as of 04/09/2018  Medication Sig  . cyclophosphamide (CYTOXAN) 50 MG capsule Take 50 mg by mouth once a week. Give on an empty stomach 1 hour before or 2 hours after meals.  Takes 8 caps at one time, once a week for 3 weeks (Monday), skips the 4th week, then repeats.  . ixazomib citrate (NINLARO) 4 MG capsule Take 4 mg by mouth once a week. Take on an empty stomach 1hr before or 2hrs after food. Do not crush, chew or open.  Skips the 4th week, the repeats  . ALLOPURINOL PO Take 1 tablet by mouth daily.   Marland Kitchen amLODipine (NORVASC) 10 MG tablet Take 10 mg by mouth daily.  Marland Kitchen aspirin EC 81 MG tablet Take 81 mg by mouth daily. Takes occasionally  . Aspirin-Salicylamide-Caffeine (BC HEADACHE POWDER PO) Take by mouth as needed.  Marland Kitchen ibuprofen (ADVIL,MOTRIN) 800 MG tablet One daily as need for pain Limit to 4 days per week  . Tamsulosin HCl (FLOMAX) 0.4 MG CAPS Take 1 capsule (0.4 mg total) by mouth daily after supper.   No facility-administered encounter medications on file as of 04/09/2018.     Activities of Daily Living In your present state of health, do you have any difficulty performing the following activities: 04/09/2018  Hearing? Y  Vision? Y  Difficulty concentrating or making decisions? Y  Walking or climbing stairs? Y  Dressing or bathing? Y  Doing errands, shopping? N  Preparing Food and eating ? N  Using the Toilet? N  In the past six months, have you accidently leaked urine? Y  Do you have problems with loss of bowel control? N  Managing your Medications? Y  Managing your Finances? Y  Housekeeping or managing your Housekeeping? N  Some recent data might be hidden    Patient Care Team: Fayrene Helper, MD as PCP - General   Assessment:   This is a routine wellness examination for Derrel.  Exercise Activities and Dietary recommendations Current Exercise Habits: Home exercise routine;The patient does not  participate in regular exercise at present, Type of exercise: walking, Intensity: Mild, Exercise limited by: neurologic condition(s);orthopedic condition(s)  Goals   None     Fall Risk Fall Risk  04/09/2018 09/17/2016 08/01/2016 08/04/2015 02/28/2015  Falls in the past year? 1 Yes No Yes No  Number falls in past yr: 1 1 - 1 -  Injury with  Fall? 1 No - - -  Comment - reveals patient accidentally stumbled - - -  Risk for fall due to : History of fall(s);Impaired balance/gait;Other (Comment) - - - -  Risk for fall due to: Comment neuropathy  - - - -  Follow up Falls prevention discussed;Education provided;Falls evaluation completed Falls evaluation completed - - -   Is the patient's home free of loose throw rugs in walkways, pet beds, electrical cords, etc?   yes      Grab bars in the bathroom? yes      Handrails on the stairs?   yes      Adequate lighting?   yes  Timed Get Up and Go Performed: unable to perform due to telephone   Depression Screen PHQ 2/9 Scores 04/09/2018 09/17/2016 08/01/2016 02/28/2015  PHQ - 2 Score '1 1 1 ' 0  PHQ- 9 Score - 4 - -    Cognitive Function     6CIT Screen 04/09/2018 09/17/2016  What Year? 0 points 0 points  What month? 0 points 0 points  What time? 0 points 0 points  Count back from 20 0 points 0 points  Months in reverse 0 points 0 points  Repeat phrase 2 points 0 points  Total Score 2 0    Immunization History  Administered Date(s) Administered  . Influenza Split 10/20/2010  . Influenza,inj,Quad PF,6+ Mos 01/12/2013, 11/10/2013  . Pneumococcal Polysaccharide-23 10/20/2010  . Td 01/02/2004    Qualifies for Shingles Vaccine? Reports having it-[facility]  Screening Tests Health Maintenance  Topic Date Due  . PNA vac Low Risk Adult (2 of 2 - PCV13) 10/20/2011  . TETANUS/TDAP  01/01/2014  . INFLUENZA VACCINE  08/02/2018  . COLONOSCOPY  01/20/2019  . Hepatitis C Screening  Completed   Cancer Screenings: Lung: Low Dose CT Chest recommended if Age 22-80  years, 30 pack-year currently smoking OR have quit w/in 15years. Patient does qualify. Colorectal: 5 year program-2017 (due in 2022)  Additional Screenings:  Hepatitis C Screening: Reactive (2015) was treated with medication      Plan:     1. Encounter for Medicare annual wellness exam  I have personally reviewed and noted the following in the patient's chart:   . Medical and social history . Use of alcohol, tobacco or illicit drugs  . Current medications and supplements . Functional ability and status . Nutritional status . Physical activity . Advanced directives . List of other physicians . Hospitalizations, surgeries, and ER visits in previous 12 months . Vitals . Screenings to include cognitive, depression, and falls . Referrals and appointments  In addition, I have reviewed and discussed with patient certain preventive protocols, quality metrics, and best practice recommendations. A written personalized care plan for preventive services as well as general preventive health recommendations were provided to patient.   I provided 25 minutes of non-face-to-face time during this encounter.   Perlie Mayo, NP  04/09/2018

## 2018-05-13 ENCOUNTER — Ambulatory Visit: Payer: Medicare Other | Admitting: Family Medicine

## 2018-05-14 ENCOUNTER — Ambulatory Visit (INDEPENDENT_AMBULATORY_CARE_PROVIDER_SITE_OTHER): Payer: Medicare Other | Admitting: Family Medicine

## 2018-05-14 ENCOUNTER — Encounter: Payer: Self-pay | Admitting: Family Medicine

## 2018-05-14 VITALS — Ht 74.0 in | Wt 210.0 lb

## 2018-05-14 DIAGNOSIS — E785 Hyperlipidemia, unspecified: Secondary | ICD-10-CM | POA: Diagnosis not present

## 2018-05-14 DIAGNOSIS — I10 Essential (primary) hypertension: Secondary | ICD-10-CM | POA: Diagnosis not present

## 2018-05-14 DIAGNOSIS — E663 Overweight: Secondary | ICD-10-CM | POA: Diagnosis not present

## 2018-05-14 DIAGNOSIS — C9000 Multiple myeloma not having achieved remission: Secondary | ICD-10-CM | POA: Diagnosis not present

## 2018-05-14 NOTE — Progress Notes (Signed)
Virtual Visit via Telephone Note   This visit type was conducted due to national recommendations for restrictions regarding the COVID-19 Pandemic (e.g. social distancing) in an effort to limit this patient's exposure and mitigate transmission in our community.  Due to his co-morbid illnesses, this patient is at least at moderate risk for complications without adequate follow up.  This format is felt to be most appropriate for this patient at this time.  The patient did not have access to video technology/had technical difficulties with video requiring transitioning to audio format only (telephone).  All issues noted in this document were discussed and addressed.  No physical exam could be performed with this format.    Evaluation Performed:  Follow-up visit  Date:  05/14/2018   ID:  [name], DOB 1945/11/27, MRN [id number]  Patient Location: Home Provider Location: Other:  Telehealth  Location of Patient: Home Location of Provider: Telehealth Consent was obtain for visit to be over via telehealth. I verified that I am speaking with the correct person using two identifiers.  PCP:  Fayrene Helper, MD   Chief Complaint:  Follow up for chronic conditions  History of Present Illness:    [name] is a 73 y.o. male with history of hypertension, obstructive sleep apnea, impaired glucose tolerance, arthritis, multiple myeloma, hypertension, hyperlipidemia, previous history of smoking 30 or more pack years, anemia, CAD with native artery among others.  [name] is a male patient of Dr. Griffin Dakin who is well-known to the clinic.  He was also followed by the [facility].  He receives most of his treatments, medications, labs via the [facility].   Today he presents for a telemedicine visit for follow-up on his multiple myeloma, hypertension, hyperlipidemia, obesity.  He reports that he is taking all of his medications as directed and without issue.  Is currently getting chemo, is just now  recently started getting his appetite back secondary to that.  Since he lost his appetite he had lost a good amount of weight placing him in the lower bracket for just overweight not obese anymore.  He gets his labs drawn with the [facility] so unsure of his most recent labs that we get says that they were good per what he was told.  He will bring them in so that they can be added to our charts.   He denies having any fevers, chills, cough, shortness of breath, dizziness, headaches, vision changes, chest pain, chest palpitations.  Reports going to the bathroom okay no changes in bowel or bladder.  Reports sleeping okay.  Denies having any wounds or skin issues.  Denies having any memory changes.  Reports reduction in energy levels.  Possibly secondary to chemo treatment.  He also attributes this to the fact that he is almost 73.  The patient does not have symptoms concerning for COVID-19 infection (fever, chills, cough, or new shortness of breath).   Overall [name] does not have much to complain about today until time of visit. Reports that he is glad that he is able to eat again.  Reports that he is glad that his energy levels have increased.  He will be following up with the [facility] for his multiple myeloma along with possibly getting an MRI secondary to having some back issues.  He reports that he will follow-up with [place] pending the results of that and let us know if we need to help him with any referrals regarding the findings of the MRI.  Past Medical, Surgical, Social History, Allergies, and Medications have been Reviewed.   Past Medical History:  Diagnosis Date  . Back pain   . Benign prostatic hypertrophy   . Cancer ([place]) 07/2016   Multiple Myeloma  . Chronic depression   . DJD (degenerative joint disease) of cervical spine   . ED (erectile dysfunction)   . Gout   . Hematospermia   . HTN (hypertension)   . Hyperlipidemia   . IGT (impaired glucose tolerance)   . Lipoma   . Multiple myeloma  ([place]) 07/2016  . Nicotine addiction   . Obesity   . PTSD (post-traumatic stress disorder)   . Vitamin D deficiency    Past Surgical History:  Procedure Laterality Date  . APPENDECTOMY    . CHOLECYSTECTOMY    . right bunionectomy    . WISDOM TOOTH EXTRACTION       Current Meds  Medication Sig  . ALLOPURINOL PO Take 1 tablet by mouth daily.   Marland Kitchen amLODipine (NORVASC) 10 MG tablet Take 10 mg by mouth daily.  Marland Kitchen aspirin EC 81 MG tablet Take 81 mg by mouth daily. Takes occasionally  . Aspirin-Salicylamide-Caffeine (BC HEADACHE POWDER PO) Take by mouth as needed.  . cyclophosphamide (CYTOXAN) 50 MG capsule Take 50 mg by mouth once a week. Give on an empty stomach 1 hour before or 2 hours after meals.  Takes 8 caps at one time, once a week for 3 weeks (Monday), skips the 4th week, then repeats.  Marland Kitchen ibuprofen (ADVIL,MOTRIN) 800 MG tablet One daily as need for pain Limit to 4 days per week  . ixazomib citrate (NINLARO) 4 MG capsule Take 4 mg by mouth once a week. Take on an empty stomach 1hr before or 2hrs after food. Do not crush, chew or open.  Skips the 4th week, the repeats  . Tamsulosin HCl (FLOMAX) 0.4 MG CAPS Take 1 capsule (0.4 mg total) by mouth daily after supper.     Allergies:   Patient has no known allergies.   Social History   Tobacco Use  . Smoking status: Former Smoker    Packs/day: 1.00    Years: 58.00    Pack years: 58.00    Types: Cigarettes    Start date: 08/29/1957    Last attempt to quit: 08/17/2016    Years since quitting: 1.7  . Smokeless tobacco: Never Used  . Tobacco comment: approx quit date   Substance Use Topics  . Alcohol use: No    Alcohol/week: 0.0 standard drinks  . Drug use: No     Family Hx: The patient's family history includes Colon cancer in his father; Diabetes in his brother; Heart disease in his mother; Hypertension in his mother; Hypokalemia in his daughter.  ROS:   Please see the history of present illness.    Review of Systems   Constitutional: Negative for chills and fever.  HENT: Negative.   Eyes: Negative.   Respiratory: Negative for cough, shortness of breath and wheezing.   Cardiovascular: Negative for palpitations and leg swelling.  Gastrointestinal: Negative.   Genitourinary: Negative.   Musculoskeletal: Negative.   Skin: Negative.   Neurological: Negative for dizziness and headaches.  Endo/Heme/Allergies: Negative.   Psychiatric/Behavioral: Negative.   All other systems reviewed and are negative.  Labs/Other Tests and Data Reviewed:    Recent Labs: No results found for requested labs within last 8760 hours.   Recent Lipid Panel Lab Results  Component Value Date/Time   CHOL 204 (H) 08/03/2015  08:51 AM   TRIG 158 (H) 08/03/2015 08:51 AM   HDL 40 08/03/2015 08:51 AM   CHOLHDL 5.1 (H) 08/03/2015 08:51 AM   LDLCALC 132 (H) 08/03/2015 08:51 AM   LDLDIRECT 102 (H) 09/07/2009 09:33 AM    Wt Readings from Last 3 Encounters:  05/14/18 210 lb (95.3 kg)  04/09/18 249 lb (112.9 kg)  09/17/16 249 lb 0.6 oz (113 kg)     Objective:    Vital Signs:  Ht _0  (1.88 m)   Wt 210 lb (95.3 kg)   BMI 26.96 kg/m    GEN:  Alert and oriented RESPIRATORY:  No noted shortness of breath and communication PSYCH:  Normal affect, mood, good communication during conversation  ASSESSMENT & PLAN:    1. Multiple myeloma, remission status unspecified ([place]) Ongoing, reports that he received chemo and that seemed to do well and he responded.  Is unsure if he is in full remission at this time.  Needs to have follow-up with his provider is getting treatment via the [place].  2. Essential hypertension Controlled at previous office visits.  Average appears to be in the 130s over the 70s.  No change in medication at this time.  Advised to continue maintaining a low sodium diet and to try to get physical activity a 30-minute minimum on most days of the week.  Reports he is just now getting his energy levels back secondary to the  chemo treatments.  3. Hyperlipidemia, unspecified hyperlipidemia type Reports that he had labs drawn last week reports that they were doing well.  Does not know the numbers of them.  Will bring into the next visit.  Previous levels of cholesterol or uncontrolled.  Not currently on any medication for this.  Low-fat diet encouraged along with exercise.  4. Overweight (BMI 25.0-29.9) Drastic weight change secondary to chemo treatment for multiple myeloma.  Has lost close to 20 some pounds.  Has just gained back about 10 pounds.  Placing him at a lower weight bracket.  No longer in the obese category.  He continues to get his appetite back, as he lost it with the chemo.  Time:   Today, I have spent 10 minutes with the patient with telehealth technology discussing the above problems.     Medication Adjustments/Labs and Tests Ordered: Current medicines are reviewed at length with the patient today.  Concerns regarding medicines are outlined above.   Tests Ordered: No orders of the defined types were placed in this encounter.   Medication Changes: No orders of the defined types were placed in this encounter.   Disposition:  Follow up in 4 month(s)  Signed, Perlie Mayo, NP  05/14/2018 3:15 PM     [facility]

## 2018-05-14 NOTE — Patient Instructions (Addendum)
    Thank you for completing your visit via telephone-telemedicine. I appreciate the opportunity to provide you with the care for your health and wellness. Today we discussed: Overall health and wellness  We are glad that you are feeling more energetic and getting her appetite back secondary to the chemo.  Please keep [place] updated on your remission status.  Additionally please let us know what your findings of your MRI stay.  And if we can help you with any referrals or processes that you might come across regarding the findings.  Please continue taking your medications as directed.  Let us know if you have any issues or if you need anything from [place] in the near future.  Otherwise we will see you back in September.  Hopefully in the office.  Please continue to practice social distancing during this time to keep you, your family, and our community safe.  [place] YOUR HANDS WELL AND FREQUENTLY. AVOID TOUCHING YOUR FACE, UNLESS YOUR HANDS ARE FRESHLY WASHED.  GET FRESH AIR DAILY. STAY HYDRATED WITH WATER.   It was a pleasure to see you and I look forward to continuing to work together on your health and well-being. Please do not hesitate to call the office if you need care or have questions about your care.  Have a wonderful day and week. With Gratitude, Cherly Beach, DNP, AGNP-BC

## 2018-06-18 ENCOUNTER — Ambulatory Visit (HOSPITAL_COMMUNITY): Payer: Medicare Other

## 2018-08-06 IMAGING — CT CT CHEST LUNG CANCER SCREENING LOW DOSE W/O CM
1 of 4 series · 5 of 40 positions shown, 7 images · non-contrast
Comparison: No priors.

CLINICAL DATA: 70-year-old male with 58 pack-year history of
smoking. Lung cancer screening examination.

EXAM:
CT CHEST WITHOUT CONTRAST LOW-DOSE FOR LUNG CANCER SCREENING
TECHNIQUE: Multidetector CT imaging of the chest was performed following the
standard protocol without IV contrast.

[ct lung segmentation data · axial · 0.82mm/px · z∈[-438,-438]mm · 5 of 341 frames shown]
[frame 1/341  mediastinal]
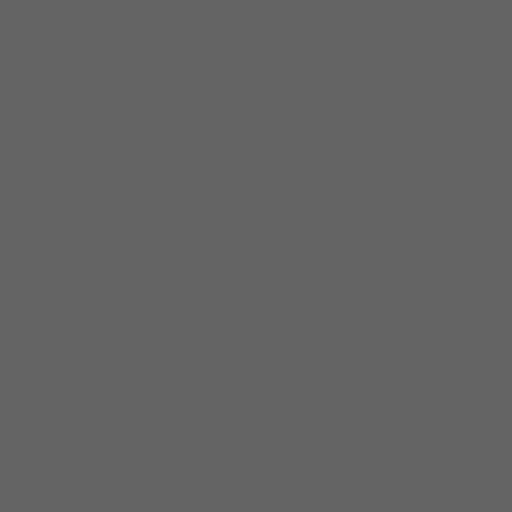
[frame 1/341  lung]
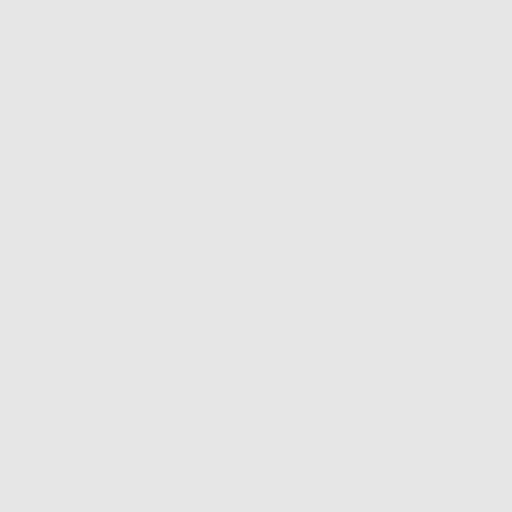
[frame 38/341  lung]
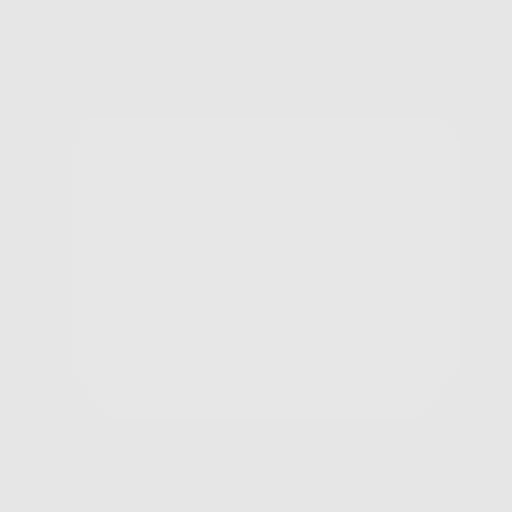
[frame 76/341  lung]
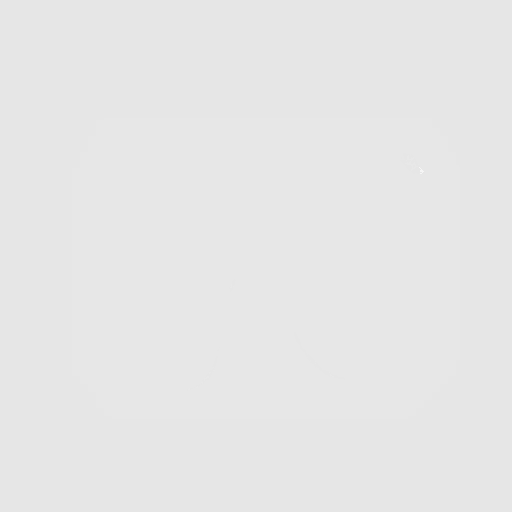
[frame 114/341  lung]
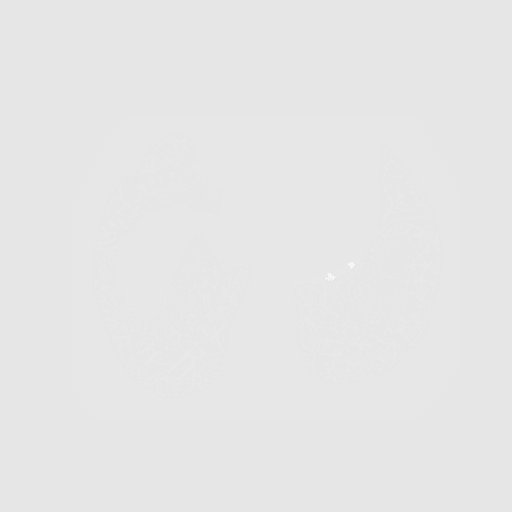
[frame 152/341  mediastinal]
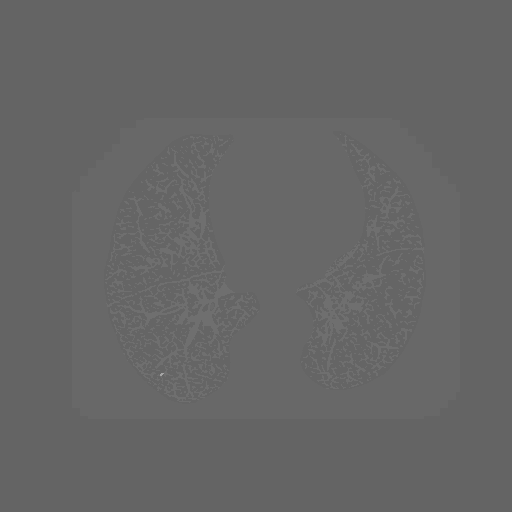
[frame 152/341  lung]
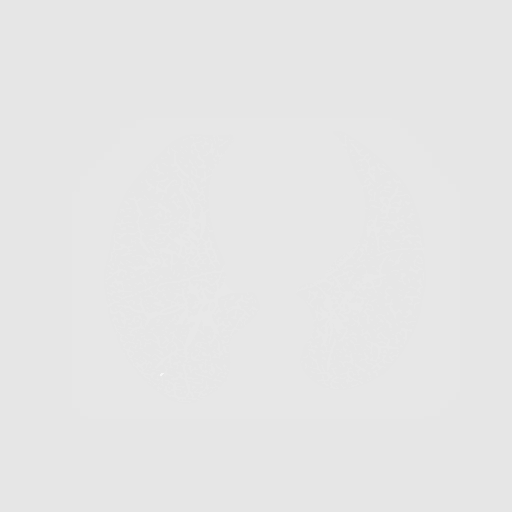

[5 of 40 positions shown; findings below may reference images not displayed]

FINDINGS: Cardiovascular: Heart size is normal. There is no significant
pericardial fluid, thickening or pericardial calcification. There is
aortic atherosclerosis, as well as atherosclerosis of the great
vessels of the mediastinum and the coronary arteries, including
calcified atherosclerotic plaque in the left main, left anterior
descending, left circumflex and right coronary arteries. Mild
calcifications of the aortic valve.

Mediastinum/Nodes: No pathologically enlarged mediastinal or hilar
lymph nodes. Please note that accurate exclusion of hilar adenopathy
is limited on noncontrast CT scans. Esophagus is unremarkable in
appearance. No axillary lymphadenopathy.

Lungs/Pleura: Irregular-shaped pulmonary nodule in the periphery of
the right lower lobe (image 187 of series 3) with a volume derived
mean diameter of only 3.6 mm. Tiny calcified granuloma in the left
upper lobe. No other suspicious appearing pulmonary nodules or
masses are noted. No acute consolidative airspace disease. No
pleural effusions. Diffuse bronchial wall thickening with mild
centrilobular and paraseptal emphysema.

Upper abdomen: Diffuse low attenuation throughout the hepatic
parenchyma, compatible with hepatic steatosis. Status post
cholecystectomy.

Musculoskeletal: There are no aggressive appearing lytic or blastic
lesions noted in the visualized portions of the skeleton. Diffusely
poor osseous mineralization, suggesting osteoporosis.
IMPRESSION: 1. Lung-RADS Category 2S, benign appearance or behavior. Continue
annual screening with low-dose chest CT without contrast in 12
months.
2. The "S" modifier above refers to potentially clinically
significant non lung cancer related findings. Specifically, there is
aortic atherosclerosis, in addition to left main and 3 vessel
coronary artery disease. Please note that although the presence of
coronary artery calcium documents the presence of coronary artery
disease, the severity of this disease and any potential stenosis
cannot be assessed on this non-gated CT examination. Assessment for
potential risk factor modification, dietary therapy or pharmacologic
therapy may be warranted, if clinically indicated.
3. Hepatic steatosis.
4. Diffuse bronchial wall thickening with very mild centrilobular
and paraseptal emphysema; imaging findings suggestive of underlying
COPD.
5. Probable osteoporosis.

## 2018-11-17 ENCOUNTER — Encounter: Payer: Self-pay | Admitting: Family Medicine

## 2018-11-17 ENCOUNTER — Other Ambulatory Visit: Payer: Self-pay

## 2018-11-17 ENCOUNTER — Ambulatory Visit (INDEPENDENT_AMBULATORY_CARE_PROVIDER_SITE_OTHER): Payer: Medicare Other | Admitting: Family Medicine

## 2018-11-17 VITALS — BP 140/80 | Ht 73.5 in | Wt 212.0 lb

## 2018-11-17 DIAGNOSIS — Z87891 Personal history of nicotine dependence: Secondary | ICD-10-CM

## 2018-11-17 DIAGNOSIS — Z6827 Body mass index (BMI) 27.0-27.9, adult: Secondary | ICD-10-CM | POA: Diagnosis not present

## 2018-11-17 DIAGNOSIS — E663 Overweight: Secondary | ICD-10-CM | POA: Diagnosis not present

## 2018-11-17 DIAGNOSIS — F17218 Nicotine dependence, cigarettes, with other nicotine-induced disorders: Secondary | ICD-10-CM

## 2018-11-17 DIAGNOSIS — C9000 Multiple myeloma not having achieved remission: Secondary | ICD-10-CM | POA: Diagnosis not present

## 2018-11-17 DIAGNOSIS — E559 Vitamin D deficiency, unspecified: Secondary | ICD-10-CM

## 2018-11-17 DIAGNOSIS — I10 Essential (primary) hypertension: Secondary | ICD-10-CM | POA: Diagnosis not present

## 2018-11-17 DIAGNOSIS — E785 Hyperlipidemia, unspecified: Secondary | ICD-10-CM | POA: Diagnosis not present

## 2018-11-17 NOTE — Progress Notes (Signed)
Virtual Visit via Telephone Note  I connected with [name] on 11/17/18 at  2:20 PM EST by telephone and verified that I am speaking with the correct person using two identifiers.  Location: Patient: store  Provider: office   I discussed the limitations, risks, security and privacy concerns of performing an evaluation and management service by telephone and the availability of in person appointments. I also discussed with the patient that there may be a patient responsible charge related to this service. The patient expressed understanding and agreed to proceed.   History of Present Illness:   f/zu chronic problems, medication and update health maintenance Pt gets primary care at the [place]. Reports doing well overall, had surgery on spine earlier this year which has been very helpful Denies recent fever or chills. Denies sinus pressure, nasal congestion, ear pain or sore throat. Denies chest congestion, productive cough or wheezing. Denies chest pains, palpitations and leg swelling Denies abdominal pain, nausea, vomiting,diarrhea or constipation.   Denies dysuria, frequency, hesitancy or incontinence. Denies uncontrolled  joint pain, swelling and limitation in mobility. Denies headaches, seizures, numbness, or tingling. Denies depression, anxiety or insomnia. Denies skin break down or rash.     Observations/Objective: BP 140/80   Ht 6' 1.5" (1.867 m)   Wt 212 lb (96.2 kg)   BMI 27.59 kg/m  Good communication with no confusion and intact memory. Alert and oriented x 3 No signs of respiratory distress during speech    Assessment and Plan: Essential hypertension Elevated systolic pressure, needs in office re eval DASH diet and commitment to daily physical activity for a minimum of 30 minutes discussed and encouraged, as a part of hypertension management. The importance of attaining a healthy weight is also discussed.  BP/Weight 11/17/2018 05/14/2018 04/09/2018 09/17/2016  08/15/2016 08/01/2016 4/59/9774  Systolic BP 142 - - 395 320 233 435  Diastolic BP 80 - - 68 80 80 78  Wt. (Lbs) 212 210 249 249.04 243 238.75 254  BMI 27.59 26.96 32.85 31.97 31.2 31.07 32.61       Multiple myeloma (HCC) Being treated through the [place] reports stability  Nicotine dependence Asked:confirms currently smokes cigarettes Assess: Unwilling to quit but cutting back Advise: needs to QUIT to reduce risk of cancer, cardio and cerebrovascular disease Assist: counseled for 5 minutes and literature provided Arrange: follow up in 3 months   History of smoking 30 or more pack years Needs annual lung scan, will follow up on this  Overweight with body mass index (BMI) of 27 to 27.9 in adult  Patient re-educated about  the importance of commitment to a  minimum of 150 minutes of exercise per week as able.  The importance of healthy food choices with portion control discussed, as well as eating regularly and within a 12 hour window most days. The need to choose "clean , green" food 50 to 75% of the time is discussed, as well as to make water the primary drink and set a goal of 64 ounces water daily.    Weight /BMI 11/17/2018 05/14/2018 04/09/2018  WEIGHT 212 lb 210 lb 249 lb  HEIGHT 6' 1.5" '6\' 2"'$  '6\' 1"'$   BMI 27.59 kg/m2 26.96 kg/m2 32.85 kg/m2         Follow Up Instructions:    I discussed the assessment and treatment plan with the patient. The patient was provided an opportunity to ask questions and all were answered. The patient agreed with the plan and demonstrated an understanding of the instructions.  The patient was advised to call back or seek an in-person evaluation if the symptoms worsen or if the condition fails to improve as anticipated.  I provided 15 minutes of non-face-to-face time during this encounter.   Tula Nakayama, MD

## 2018-11-17 NOTE — Patient Instructions (Addendum)
F/U in office with MD in 6 months  [facility] QUITTING CIGARS, NOW AT 5 / DAY, PLEASE SET A QUIT DATE AND WORK AT THIS, QUIT DATE IS January 01, 2019.  PLEASE CALL AND COME IN FOR PNEUMONIA AND FLU VACCINE  [facility] SO THAT I CAN SEE THEM , NONE IN PAST 2 YEARS   BEST FOR 2020/2021  [facility], ESPECIALLY SINCE YOU ANTICIPATE NECK SURGERY IJ THE NEAR FUTURE, THANKFUL THAT LOW BACK SURGERY WENT WELL  Thanks for choosing [facility], we consider it a privelige to serve you.

## 2018-11-23 ENCOUNTER — Encounter: Payer: Self-pay | Admitting: Family Medicine

## 2018-11-23 DIAGNOSIS — Z6827 Body mass index (BMI) 27.0-27.9, adult: Secondary | ICD-10-CM | POA: Insufficient documentation

## 2018-11-23 NOTE — Assessment & Plan Note (Signed)
Elevated systolic pressure, needs in office re eval DASH diet and commitment to daily physical activity for a minimum of 30 minutes discussed and encouraged, as a part of hypertension management. The importance of attaining a healthy weight is also discussed.  BP/Weight 11/17/2018 05/14/2018 04/09/2018 09/17/2016 08/15/2016 08/01/2016 [id number]  Systolic BP [id number] - - [id number]  Diastolic BP 80 - - 68 80 80 78  Wt. (Lbs) 212 210 249 249.04 243 238.75 254  BMI 27.59 26.96 32.85 31.97 31.2 31.07 32.61

## 2018-11-23 NOTE — Assessment & Plan Note (Signed)
Being treated through the [place] reports stability

## 2018-11-23 NOTE — Assessment & Plan Note (Signed)
Needs annual lung scan, will follow up on this

## 2018-11-23 NOTE — Assessment & Plan Note (Signed)
  Patient re-educated about  the importance of commitment to a  minimum of 150 minutes of exercise per week as able.  The importance of healthy food choices with portion control discussed, as well as eating regularly and within a 12 hour window most days. The need to choose "clean , green" food 50 to 75% of the time is discussed, as well as to make water the primary drink and set a goal of 64 ounces water daily.    Weight /BMI 11/17/2018 05/14/2018 04/09/2018  WEIGHT 212 lb 210 lb 249 lb  HEIGHT 6' 1.5" 6\' 2"  6\' 1"   BMI 27.59 kg/m2 26.96 kg/m2 32.85 kg/m2

## 2018-11-23 NOTE — Assessment & Plan Note (Signed)
Asked:confirms currently smokes cigarettes Assess: Unwilling to quit but cutting back Advise: needs to QUIT to reduce risk of cancer, cardio and cerebrovascular disease Assist: counseled for 5 minutes and literature provided Arrange: follow up in 3 months  

## 2018-12-11 ENCOUNTER — Encounter: Payer: Self-pay | Admitting: Gastroenterology

## 2019-05-18 ENCOUNTER — Ambulatory Visit: Payer: Medicare Other | Admitting: Family Medicine

## 2019-06-03 ENCOUNTER — Other Ambulatory Visit: Payer: Self-pay

## 2019-06-03 ENCOUNTER — Encounter: Payer: Medicare Other | Admitting: Family Medicine

## 2019-06-04 NOTE — Progress Notes (Signed)
Pt no showed phone appt  This encounter was created in error - please disregard.

## 2019-08-24 ENCOUNTER — Ambulatory Visit: Payer: Medicare Other | Admitting: Family Medicine

## 2019-08-26 ENCOUNTER — Telehealth: Payer: Medicare Other

## 2019-08-27 ENCOUNTER — Telehealth: Payer: Medicare Other

## 2019-08-27 ENCOUNTER — Other Ambulatory Visit: Payer: Self-pay

## 2019-11-19 ENCOUNTER — Ambulatory Visit (INDEPENDENT_AMBULATORY_CARE_PROVIDER_SITE_OTHER): Payer: Medicare Other | Admitting: Nurse Practitioner

## 2019-11-19 ENCOUNTER — Other Ambulatory Visit: Payer: Self-pay

## 2019-11-19 ENCOUNTER — Encounter: Payer: Self-pay | Admitting: Nurse Practitioner

## 2019-11-19 DIAGNOSIS — Z0000 Encounter for general adult medical examination without abnormal findings: Secondary | ICD-10-CM | POA: Diagnosis not present

## 2019-11-19 NOTE — Patient Instructions (Signed)
[name] , Thank you for taking time to come for your Medicare Wellness Visit. I appreciate your ongoing commitment to your health goals. Please review the following plan we discussed and let me know if I can assist you in the future.   Screening recommendations/referrals: Colonoscopy: Pt is scheduled through the [facility] medical [facility] in one month.   Recommended yearly ophthalmology/optometry visit for glaucoma screening and checkup Recommended yearly dental visit for hygiene and checkup  Vaccinations: Influenza vaccine: Plans to receive at the [facility] Pneumococcal vaccine: Plans to receive at the [place].   Tdap vaccine: Plans to receive at the [place].  [facility]    Advanced directives: N/A   Conditions/risks identified: Pt suffers from PTSD and currently in therapy and receives tx for this.   Next appointment: None scheduled at this time.   Preventive Care 74 Years and Older, Male Preventive care refers to lifestyle choices and visits with your health care provider that can promote health and wellness. What does preventive care include?  A yearly physical exam. This is also called an annual well check.  Dental exams once or twice a year.  Routine eye exams. Ask your health care provider how often you should have your eyes checked.  Personal lifestyle choices, including:  Daily care of your teeth and gums.  Regular physical activity.  Eating a healthy diet.  Avoiding tobacco and drug use.  Limiting alcohol use.  Practicing safe sex.  Taking low doses of aspirin every day.  Taking vitamin and mineral supplements as recommended by your health care provider. What happens during an annual well check? The services and screenings done by your health care provider during your annual well check will depend on your age, overall health, lifestyle risk factors, and family history of disease. Counseling  Your health care provider may ask you questions about  your:  Alcohol use.  Tobacco use.  Drug use.  Emotional well-being.  Home and relationship well-being.  Sexual activity.  Eating habits.  History of falls.  Memory and ability to understand (cognition).  Work and work Statistician. Screening  You may have the following tests or measurements:  Height, weight, and BMI.  Blood pressure.  Lipid and cholesterol levels. These may be checked every 5 years, or more frequently if you are over 72 years old.  Skin check.  Lung cancer screening. You may have this screening every year starting at age 84 if you have a 30-pack-year history of smoking and currently smoke or have quit within the past 15 years.  Fecal occult blood test (FOBT) of the stool. You may have this test every year starting at age 72.  Flexible sigmoidoscopy or colonoscopy. You may have a sigmoidoscopy every 5 years or a colonoscopy every 10 years starting at age 21.  Prostate cancer screening. Recommendations will vary depending on your family history and other risks.  Hepatitis C blood test.  Hepatitis B blood test.  Sexually transmitted disease (STD) testing.  Diabetes screening. This is done by checking your blood sugar (glucose) after you have not eaten for a while (fasting). You may have this done every 1-3 years.  Abdominal aortic aneurysm (AAA) screening. You may need this if you are a current or former smoker.  Osteoporosis. You may be screened starting at age 27 if you are at high risk. Talk with your health care provider about your test results, treatment options, and if necessary, the need for more tests. Vaccines  Your  health care provider may recommend certain vaccines, such as:  Influenza vaccine. This is recommended every year.  Tetanus, diphtheria, and acellular pertussis (Tdap, Td) vaccine. You may need a Td booster every 10 years.  Zoster vaccine. You may need this after age 58.  Pneumococcal 13-valent conjugate (PCV13) vaccine.  One dose is recommended after age 62.  Pneumococcal polysaccharide (PPSV23) vaccine. One dose is recommended after age 65. Talk to your health care provider about which screenings and vaccines you need and how often you need them. This information is not intended to replace advice given to you by your health care provider. Make sure you discuss any questions you have with your health care provider. Document Released: 01/14/2015 Document Revised: 09/07/2015 Document Reviewed: 10/19/2014 Elsevier Interactive Patient Education  2017 [place] Prevention in the Home Falls can cause injuries. They can happen to people of all ages. There are many things you can do to make your home safe and to help prevent falls. What can I do on the outside of my home?  Regularly fix the edges of walkways and driveways and fix any cracks.  Remove anything that might make you trip as you walk through a door, such as a raised step or threshold.  Trim any bushes or trees on the path to your home.  Use bright outdoor lighting.  Clear any walking paths of anything that might make someone trip, such as rocks or tools.  Regularly check to see if handrails are loose or broken. Make sure that both sides of any steps have handrails.  Any raised decks and porches should have guardrails on the edges.  Have any leaves, snow, or ice cleared regularly.  Use sand or salt on walking paths during winter.  Clean up any spills in your garage right away. This includes oil or grease spills. What can I do in the bathroom?  Use night lights.  Install grab bars by the toilet and in the tub and shower. Do not use towel bars as grab bars.  Use non-skid mats or decals in the tub or shower.  If you need to sit down in the shower, use a plastic, non-slip stool.  Keep the floor dry. Clean up any water that spills on the floor as soon as it happens.  Remove soap buildup in the tub or shower regularly.  Attach bath  mats securely with double-sided non-slip rug tape.  Do not have throw rugs and other things on the floor that can make you trip. What can I do in the bedroom?  Use night lights.  Make sure that you have a light by your bed that is easy to reach.  Do not use any sheets or blankets that are too big for your bed. They should not hang down onto the floor.  Have a firm chair that has side arms. You can use this for support while you get dressed.  Do not have throw rugs and other things on the floor that can make you trip. What can I do in the kitchen?  Clean up any spills right away.  Avoid walking on wet floors.  Keep items that you use a lot in easy-to-reach places.  If you need to reach something above you, use a strong step stool that has a grab bar.  Keep electrical cords out of the way.  Do not use floor polish or wax that makes floors slippery. If you must use wax, use non-skid floor wax.  Do not  have throw rugs and other things on the floor that can make you trip. What can I do with my stairs?  Do not leave any items on the stairs.  Make sure that there are handrails on both sides of the stairs and use them. Fix handrails that are broken or loose. Make sure that handrails are as long as the stairways.  Check any carpeting to make sure that it is firmly attached to the stairs. Fix any carpet that is loose or worn.  Avoid having throw rugs at the top or bottom of the stairs. If you do have throw rugs, attach them to the floor with carpet tape.  Make sure that you have a light switch at the top of the stairs and the bottom of the stairs. If you do not have them, ask someone to add them for you. What else can I do to help prevent falls?  Wear shoes that:  Do not have high heels.  Have rubber bottoms.  Are comfortable and fit you well.  Are closed at the toe. Do not wear sandals.  If you use a stepladder:  Make sure that it is fully opened. Do not climb a closed  stepladder.  Make sure that both sides of the stepladder are locked into place.  Ask someone to hold it for you, if possible.  Clearly mark and make sure that you can see:  Any grab bars or handrails.  First and last steps.  Where the edge of each step is.  Use tools that help you move around (mobility aids) if they are needed. These include:  Canes.  Walkers.  Scooters.  Crutches.  Turn on the lights when you go into a dark area. Replace any light bulbs as soon as they burn out.  Set up your furniture so you have a clear path. Avoid moving your furniture around.  If any of your floors are uneven, fix them.  If there are any pets around you, be aware of where they are.  Review your medicines with your doctor. Some medicines can make you feel dizzy. This can increase your chance of falling. Ask your doctor what other things that you can do to help prevent falls. This information is not intended to replace advice given to you by your health care provider. Make sure you discuss any questions you have with your health care provider. Document Released: 10/14/2008 Document Revised: 05/26/2015 Document Reviewed: 01/22/2014 Elsevier Interactive Patient Education  2017 [employer].

## 2019-11-19 NOTE — Progress Notes (Addendum)
Subjective:   [name] is a 74 y.o. male who presents for Medicare Annual/Subsequent preventive examination.        Objective:    There were no vitals filed for this visit. There is no height or weight on file to calculate BMI.  Advanced Directives 04/09/2018 09/17/2016  Does Patient Have a Medical Advance Directive? No No  Does patient want to make changes to medical advance directive? No - Patient declined -  Would patient like information on creating a medical advance directive? - Yes (MAU/Ambulatory/Procedural Areas - Information given)    Current Medications (verified) Outpatient Encounter Medications as of 11/19/2019  Medication Sig  . amLODipine (NORVASC) 10 MG tablet Take 10 mg by mouth daily.  Marland Kitchen aspirin EC 81 MG tablet Take 81 mg by mouth daily. Takes occasionally  . Aspirin-Salicylamide-Caffeine (BC HEADACHE POWDER PO) Take by mouth as needed.  . cyclophosphamide (CYTOXAN) 50 MG capsule Take 50 mg by mouth once a week. Give on an empty stomach 1 hour before or 2 hours after meals.  Takes 8 caps at one time, once a week for 3 weeks (Monday), skips the 4th week, then repeats.  Marland Kitchen dexamethasone (DECADRON) 4 MG tablet Take 5 tablets by mouth every Monday  . ibuprofen (ADVIL,MOTRIN) 800 MG tablet One daily as need for pain Limit to 4 days per week  . ixazomib citrate (NINLARO) 4 MG capsule Take 4 mg by mouth once a week. Take on an empty stomach 1hr before or 2hrs after food. Do not crush, chew or open.  Skips the 4th week, the repeats  . oxyCODONE-acetaminophen (PERCOCET) 10-325 MG tablet Take 1 tablet by mouth every 4-6 hrs as needed for pain, do not exceed 6 tablets a day   No facility-administered encounter medications on file as of 11/19/2019.    Allergies (verified) Patient has no known allergies.   History: Past Medical History:  Diagnosis Date  . Back pain   . Benign prostatic hypertrophy   . Cancer ([place]) 07/2016   Multiple Myeloma  . Chronic  depression   . DJD (degenerative joint disease) of cervical spine   . ED (erectile dysfunction)   . Gout   . Hematospermia   . HTN (hypertension)   . Hyperlipidemia   . IGT (impaired glucose tolerance)   . Lipoma   . Multiple myeloma ([place]) 07/2016  . Nicotine addiction   . Obesity   . PTSD (post-traumatic stress disorder)   . Vitamin D deficiency    Past Surgical History:  Procedure Laterality Date  . APPENDECTOMY    . CHOLECYSTECTOMY    . right bunionectomy    . SPINE SURGERY  2002   lumbar  . SPINE SURGERY  2020   llumbar  . WISDOM TOOTH EXTRACTION     Family History  Problem Relation Age of Onset  . Heart disease Mother   . Hypertension Mother   . Colon cancer Father   . Diabetes Brother   . Hypokalemia Daughter    Social History   Socioeconomic History  . Marital status: Married    Spouse name: Not on file  . Number of children: Not on file  . Years of education: Not on file  . Highest education level: Not on file  Occupational History  . Not on file  Tobacco Use  . Smoking status: Current Every Day Smoker    Packs/day: 0.25    Years: 58.00    Pack years: 14.50    Types:  Cigars    Start date: 08/29/1957    Last attempt to quit: 08/17/2016    Years since quitting: 3.2  . Smokeless tobacco: Never Used  . Tobacco comment: approx quit date   Vaping Use  . Vaping Use: Never used  Substance and Sexual Activity  . Alcohol use: No    Alcohol/week: 0.0 standard drinks  . Drug use: No  . Sexual activity: Not on file  Other Topics Concern  . Not on file  Social History Narrative  . Not on file   Social Determinants of Health   Financial Resource Strain:   . Difficulty of Paying Living Expenses: Not on file  Food Insecurity:   . Worried About Running Out of Food in the Last Year: Not on file  . Ran Out of Food in the Last Year: Not on file  Transportation Needs:   . Lack of Transportation (Medical): Not on file  . Lack of Transportation (Non-Medical):  Not on file  Physical Activity:   . Days of Exercise per Week: Not on file  . Minutes of Exercise per Session: Not on file  Stress:   . Feeling of Stress : Not on file  Social Connections:   . Frequency of Communication with Friends and Family: Not on file  . Frequency of Social Gatherings with Friends and Family: Not on file  . Attends Religious Services: Not on file  . Active Member of Clubs or Organizations: Not on file  . Attends Club or Organization Meetings: Not on file  . Marital Status: Not on file    Tobacco Counseling Ready to quit: Not Answered Counseling given: Not Answered Comment: approx quit date                   Diabetic? No          Activities of Daily Living No flowsheet data found.  Patient Care Team: Simpson, Margaret E, MD as PCP - General  Indicate any recent Medical Services you may have received from other than [facility] providers in the past year (date may be approximate).     Assessment:   This is a routine wellness examination for [name].  Hearing/Vision screen No exam data present  Dietary issues and exercise activities discussed:    Goals   None    Depression Screen PHQ 2/9 Scores 05/14/2018 04/09/2018 09/17/2016 08/01/2016 02/28/2015 01/16/2012  PHQ - 2 Score 1 1 1 1 0 6  PHQ- 9 Score - - 4 - - 22    Fall Risk Fall Risk  11/17/2018 05/14/2018 04/09/2018 09/17/2016 08/01/2016  Falls in the past year? 0 1 1 Yes No  Number falls in past yr: 0 1 1 1 -  Injury with Fall? 0 1 1 No -  Comment - - - reveals patient accidentally stumbled -  Risk for fall due to : - - History of fall(s);Impaired balance/gait;Other (Comment) - -  Risk for fall due to: Comment - - neuropathy  - -  Follow up - - Falls prevention discussed;Education provided;Falls evaluation completed Falls evaluation completed -    Any stairs in or around the home? Yes  If so, are there any without handrails? Yes  Home free of loose throw rugs in walkways, pet beds,  electrical cords, etc? Yes  Adequate lighting in your home to reduce risk of falls? Yes   ASSISTIVE DEVICES UTILIZED TO PREVENT FALLS:  Life alert? No  Use of a cane, walker or w/c? Yes  Grab   bars in the bathroom? Yes  Shower chair or bench in shower? No  Elevated toilet seat or a handicapped toilet? No   TIMED UP AND GO:  Was the test performed? No .       Cognitive Function:     6CIT Screen 04/09/2018 09/17/2016  What Year? 0 points 0 points  What month? 0 points 0 points  What time? 0 points 0 points  Count back from 20 0 points 0 points  Months in reverse 0 points 0 points  Repeat phrase 2 points 0 points  Total Score 2 0    Immunizations Immunization History  Administered Date(s) Administered  . Influenza Split 10/20/2010  . Influenza,inj,Quad PF,6+ Mos 01/12/2013, 11/10/2013  . Pneumococcal Polysaccharide-23 10/20/2010  . Td 01/02/2004    TDAP status: Due, Education has been provided regarding the importance of this vaccine. Advised may receive this vaccine at local pharmacy or Health Dept. Aware to provide a copy of the vaccination record if obtained from local pharmacy or Health Dept. Verbalized acceptance and understanding. Flu Vaccine status: Declined, Education has been provided regarding the importance of this vaccine but patient still declined. Advised may receive this vaccine at local pharmacy or Health Dept. Aware to provide a copy of the vaccination record if obtained from local pharmacy or Health Dept. Verbalized acceptance and understanding. Pneumococcal vaccine status: Declined,  Education has been provided regarding the importance of this vaccine but patient still declined. Advised may receive this vaccine at local pharmacy or Health Dept. Aware to provide a copy of the vaccination record if obtained from local pharmacy or Health Dept. Verbalized acceptance and understanding.  Covid-19 vaccine status: Completed vaccines  Qualifies for Shingles Vaccine? Yes    Zostavax completed No   Shingrix Completed?: No.    Education has been provided regarding the importance of this vaccine. Patient has been advised to call insurance company to determine out of pocket expense if they have not yet received this vaccine. Advised may also receive vaccine at local pharmacy or Health Dept. Verbalized acceptance and understanding.  Screening Tests Health Maintenance  Topic Date Due  . COVID-19 Vaccine (1) Never done  . PNA vac Low Risk Adult (2 of 2 - PCV13) 10/20/2011  . TETANUS/TDAP  01/01/2014  . COLONOSCOPY  01/20/2019  . INFLUENZA VACCINE  08/02/2019  . Hepatitis C Screening  Completed    Health Maintenance  Health Maintenance Due  Topic Date Due  . COVID-19 Vaccine (1) Never done  . PNA vac Low Risk Adult (2 of 2 - PCV13) 10/20/2011  . TETANUS/TDAP  01/01/2014  . COLONOSCOPY  01/20/2019  . INFLUENZA VACCINE  08/02/2019    Colorectal cancer screening: [facility] Pt aware the office will call re: appt.  Lung Cancer Screening: (Low Dose CT Chest recommended if Age 55-80 years, 30 pack-year currently smoking OR have quit w/in 15years.) does qualify.    Additional Screening:  Hepatitis C Screening: does qualify; Completed.   Vision Screening: Recommended annual ophthalmology exams for early detection of glaucoma and other disorders of the eye. Is the patient up to date with their annual eye exam?  Yes  Who is the provider or what is the [facility] of the office in which the patient [facility]   If pt is not established with a provider, would they like to be referred to a provider to establish care? No .   Dental Screening: Recommended annual dental   exams for proper oral hygiene  Community Resource Referral / Chronic Care Management: CRR required this visit?  No   CCM required this visit?  No      Plan:     I have personally reviewed and noted the following in the patient's  chart:   . Medical and social history . Use of alcohol, tobacco or illicit drugs  . Current medications and supplements . Functional ability and status . Nutritional status . Physical activity . Advanced directives . List of other physicians . Hospitalizations, surgeries, and ER visits in previous 12 months . Vitals . Screenings to include cognitive, depression, and falls . Referrals and appointments  In addition, I have reviewed and discussed with patient certain preventive protocols, quality metrics, and best practice recommendations. A written personalized care plan for preventive services as well as general preventive health recommendations were provided to patient.     Lonn Georgia, LPN   [id number]   Nurse Notes: AWV conducted over the phone with pt consent to televisit via audio. Pt was outside of the home at the time of call, and provider located in the office. Pt states his care gaps will be taken care [facility] at [facility]. Call took approx 20 min.    Date:  11/19/2019   Location of Patient: Home Location of Provider: Office Consent was obtain for visit to be over via telehealth. I verified that I am speaking with the correct person using two identifiers.  I connected with  [name] on 11/19/19 via telephone and verified that I am speaking with the correct person using two identifiers.   I discussed the limitations of evaluation and management by telemedicine. The patient expressed understanding and agreed to proceed.

## 2020-11-21 ENCOUNTER — Other Ambulatory Visit: Payer: Self-pay

## 2020-11-21 ENCOUNTER — Ambulatory Visit (INDEPENDENT_AMBULATORY_CARE_PROVIDER_SITE_OTHER): Payer: Medicare Other

## 2020-11-21 VITALS — Ht 74.0 in | Wt 206.0 lb

## 2020-11-21 DIAGNOSIS — Z0000 Encounter for general adult medical examination without abnormal findings: Secondary | ICD-10-CM

## 2020-11-21 NOTE — Patient Instructions (Addendum)
[name] , Thank you for taking time to come for your Medicare Wellness Visit. I appreciate your ongoing commitment to your health goals. Please review the following plan we discussed and let me know if I can assist you in the future.   These are the goals we discussed:  Goals      LIFESTYLE - DECREASE FALLS RISK     Patient Stated     Get flu and shingles vaccines through [facility]     Stay healthy         This is a list of the screening recommended for you and due dates:  Health Maintenance  Topic Date Due   Zoster (Shingles) Vaccine (1 of 2) Never done   Pneumonia Vaccine (2 - PCV) 10/20/2011   Tetanus Vaccine  01/01/2014   Colon Cancer Screening  01/20/2019   COVID-19 Vaccine (4 - Booster for Moderna series) 02/15/2020   Flu Shot  08/01/2020   Hepatitis C Screening: USPSTF Recommendation to screen - Ages 18-79 yo.  Completed   HPV Vaccine  Aged Out     Health Maintenance, Male Adopting a healthy lifestyle and getting preventive care are important in promoting health and wellness. Ask your health care provider about: The right schedule for you to have regular tests and exams. Things you can do on your own to prevent diseases and keep yourself healthy. What should I know about diet, weight, and exercise? Eat a healthy diet  Eat a diet that includes plenty of vegetables, fruits, low-fat dairy products, and lean protein. Do not eat a lot of foods that are high in solid fats, added sugars, or sodium. Maintain a healthy weight Body mass index (BMI) is a measurement that can be used to identify possible weight problems. It estimates body fat based on height and weight. Your health care provider can help determine your BMI and help you achieve or maintain a healthy weight. Get regular exercise Get regular exercise. This is one of the most important things you can do for your health. Most adults should: Exercise for at least 150 minutes each week. The  exercise should increase your heart rate and make you sweat (moderate-intensity exercise). Do strengthening exercises at least twice a week. This is in addition to the moderate-intensity exercise. Spend less time sitting. Even light physical activity can be beneficial. Watch cholesterol and blood lipids Have your blood tested for lipids and cholesterol at 75 years of age, then have this test every 5 years. You may need to have your cholesterol levels checked more often if: Your lipid or cholesterol levels are high. You are older than 75 years of age. You are at high risk for heart disease. What should I know about cancer screening? Many types of cancers can be detected early and may often be prevented. Depending on your health history and family history, you may need to have cancer screening at various ages. This may include screening for: Colorectal cancer. Prostate cancer. Skin cancer. Lung cancer. What should I know about heart disease, diabetes, and high blood pressure? Blood pressure and heart disease High blood pressure causes heart disease and increases the risk of stroke. This is more likely to develop in people who have high blood pressure readings or are overweight. Talk with your health care provider about your target blood pressure readings. Have your blood pressure checked: Every 3-5 years if you are 41-89 years of age. Every year if you are 27  years old or older. If you are between the ages of 67 and 30 and are a current or former smoker, ask your health care provider if you should have a one-time screening for abdominal aortic aneurysm (AAA). Diabetes Have regular diabetes screenings. This checks your fasting blood sugar level. Have the screening done: Once every three years after age 62 if you are at a normal weight and have a low risk for diabetes. More often and at a younger age if you are overweight or have a high risk for diabetes. What should I know about preventing  infection? Hepatitis B If you have a higher risk for hepatitis B, you should be screened for this virus. Talk with your health care provider to find out if you are at risk for hepatitis B infection. Hepatitis C Blood testing is recommended for: Everyone born from [birth year] through 1965. Anyone with known risk factors for hepatitis C. Sexually transmitted infections (STIs) You should be screened each year for STIs, including gonorrhea and chlamydia, if: You are sexually active and are younger than 75 years of age. You are older than 75 years of age and your health care provider tells you that you are at risk for this type of infection. Your sexual activity has changed since you were last screened, and you are at increased risk for chlamydia or gonorrhea. Ask your health care provider if you are at risk. Ask your health care provider about whether you are at high risk for HIV. Your health care provider may recommend a prescription medicine to help prevent HIV infection. If you choose to take medicine to prevent HIV, you should first get tested for HIV. You should then be tested every 3 months for as long as you are taking the medicine. Follow these instructions at home: Alcohol use Do not drink alcohol if your health care provider tells you not to drink. If you drink alcohol: Limit how much you have to 0-2 drinks a day. Know how much alcohol is in your drink. In the [place] one drink equals one 12 oz bottle of beer (355 mL), one 5 oz glass of wine (148 mL), or one 1 oz glass of hard liquor (44 mL). Lifestyle Do not use any products that contain nicotine or tobacco. These products include cigarettes, chewing tobacco, and vaping devices, such as e-cigarettes. If you need help quitting, ask your health care provider. Do not use street drugs. Do not share needles. Ask your health care provider for help if you need support or information about quitting drugs. General instructions Schedule regular health,  dental, and eye exams. Stay current with your vaccines. Tell your health care provider if: You often feel depressed. You have ever been abused or do not feel safe at home. Summary Adopting a healthy lifestyle and getting preventive care are important in promoting health and wellness. Follow your health care provider's instructions about healthy diet, exercising, and getting tested or screened for diseases. Follow your health care provider's instructions on monitoring your cholesterol and blood pressure. This information is not intended to replace advice given to you by your health care provider. Make sure you discuss any questions you have with your health care provider. Document Revised: 05/09/2020 Document Reviewed: 05/09/2020 Elsevier Patient Education  [place].

## 2020-11-21 NOTE — Progress Notes (Signed)
Subjective:   [name] is a 75 y.o. male who presents for Medicare Annual/Subsequent preventive examination.   I connected with  Annamary Rummage on 11/21/20 by a audio enabled telemedicine application and verified that I am speaking with the correct person using two identifiers.  Patient Location: Home  Provider Location: Office/Clinic  I discussed the limitations of evaluation and management by telemedicine. The patient expressed understanding and agreed to proceed.    Review of Systems     Cardiac Risk Factors include: advanced age (>82mn, >>40women);dyslipidemia;hypertension;obesity (BMI >30kg/m2);smoking/ tobacco exposure;sedentary lifestyle;male gender     Objective:    Today's Vitals   11/21/20 1006  Weight: 206 lb (93.4 kg)  Height: '6\' 2"'  (1.88 m)   Body mass index is 26.45 kg/m.  Advanced Directives 11/21/2020 11/19/2019 04/09/2018 09/17/2016  Does Patient Have a Medical Advance Directive? No No No No  Does patient want to make changes to medical advance directive? - - No - Patient declined -  Would patient like information on creating a medical advance directive? Yes (ED - Information included in AVS) No - Patient declined - Yes (MAU/Ambulatory/Procedural Areas - Information given)    Current Medications (verified) Outpatient Encounter Medications as of 11/21/2020  Medication Sig   amLODipine (NORVASC) 10 MG tablet Take 10 mg by mouth daily.   aspirin EC 81 MG tablet Take 81 mg by mouth daily. Takes occasionally   Aspirin-Salicylamide-Caffeine (BC HEADACHE POWDER PO) Take by mouth as needed.   cyclophosphamide (CYTOXAN) 50 MG capsule Take 50 mg by mouth once a week. Give on an empty stomach 1 hour before or 2 hours after meals.  Takes 8 caps at one time, once a week for 3 weeks (Monday), skips the 4th week, then repeats.   dexamethasone (DECADRON) 4 MG tablet Take 5 tablets by mouth every Monday   ibuprofen (ADVIL,MOTRIN) 800 MG tablet One daily as need  for pain Limit to 4 days per week   ixazomib citrate (NINLARO) 4 MG capsule Take 4 mg by mouth once a week. Take on an empty stomach 1hr before or 2hrs after food. Do not crush, chew or open.  Skips the 4th week, the repeats   oxyCODONE-acetaminophen (PERCOCET) 10-325 MG tablet Take 1 tablet by mouth every 4-6 hrs as needed for pain, do not exceed 6 tablets a day   No facility-administered encounter medications on file as of 11/21/2020.    Allergies (verified) Patient has no known allergies.   History: Past Medical History:  Diagnosis Date   Back pain    Benign prostatic hypertrophy    [place]   Multiple Myeloma   Chronic depression    DJD (degenerative joint disease) of cervical spine    ED (erectile dysfunction)    Gout    Hematospermia    HTN (hypertension)    Hyperlipidemia    IGT (impaired glucose tolerance)    Lipoma    [place]   Nicotine addiction    Obesity    PTSD (post-traumatic stress disorder)    Vitamin D deficiency    Past Surgical History:  Procedure Laterality Date   APPENDECTOMY     CHOLECYSTECTOMY     right bunionectomy     SPINE SURGERY  2002   lumbar   SPINE SURGERY  2020   llumbar   WISDOM TOOTH EXTRACTION     Family History  Problem Relation Age of Onset   Heart disease Mother    Hypertension  Mother    Colon cancer Father    Diabetes Brother    Hypokalemia Daughter    Social History   Socioeconomic History   Marital status: Married    Spouse name: Not on file   Number of children: Not on file   Years of education: Not on file   Highest education level: Not on file  Occupational History   Not on file  Tobacco Use   Smoking status: Every Day    Packs/day: 0.25    Years: 58.00    Pack years: 14.50    Types: Cigars, Cigarettes    Start date: 08/29/1957    Last attempt to quit: 08/17/2016    Years since quitting: 4.2   Smokeless tobacco: Never   Tobacco comments:    approx quit date   Vaping  Use   Vaping Use: Never used  Substance and Sexual Activity   Alcohol use: No    Alcohol/week: 0.0 standard drinks   Drug use: No   Sexual activity: Not on file  Other Topics Concern   Not on file  Social History Narrative   Not on file   Social Determinants of Health   Financial Resource Strain: Low Risk    Difficulty of Paying Living Expenses: Not very hard  Food Insecurity: No Food Insecurity   Worried About Charity fundraiser in the Last Year: Never true   Ran Out of Food in the Last Year: Never true  Transportation Needs: No Transportation Needs   Lack of Transportation (Medical): No   Lack of Transportation (Non-Medical): No  Physical Activity: Insufficiently Active   Days of Exercise per Week: 3 days   Minutes of Exercise per Session: 20 min  Stress: Not on file  Social Connections: Moderately Integrated   Frequency of Communication with Friends and Family: More than three times a week   Frequency of Social Gatherings with Friends and Family: More than three times a week   Attends Religious Services: More than 4 times per year   Active Member of [employer] or Organizations: No   Attends Music therapist: Never   Marital Status: Married    Tobacco Counseling Ready to quit: Not Answered Counseling given: Not Answered Tobacco comments: approx quit date    Clinical Intake:  Pre-visit preparation completed: No  Pain : No/denies pain     Nutritional Status: BMI > 30  Obese Diabetes: No  How often do you need to have someone help you when you read instructions, pamphlets, or other written materials from your doctor or pharmacy?: 1 - Never  Diabetic? no  Interpreter Needed?: No      Activities of Daily Living In your present state of health, do you have any difficulty performing the following activities: 11/21/2020  Hearing? N  Vision? N  Difficulty concentrating or making decisions? N  Walking or climbing stairs? Y  Dressing or bathing? Y   Doing errands, shopping? Y  Preparing Food and eating ? N  Using the Toilet? N  In the past six months, have you accidently leaked urine? N  Do you have problems with loss of bowel control? N  Managing your Medications? N  Managing your Finances? N  Housekeeping or managing your Housekeeping? N  Some recent data might be hidden    Patient Care Team: Fayrene Helper, MD as PCP - General  Indicate any recent Medical Services you may have received from other than [facility] providers in the past year (date may  be approximate).     Assessment:   This is a routine wellness examination for Davan.  Hearing/Vision screen No results found.  Dietary issues and exercise activities discussed: Current Exercise Habits: The patient does not participate in regular exercise at present, Exercise limited by: Other - see comments (multiple myleoma)   Goals Addressed             This Visit's Progress    LIFESTYLE - DECREASE FALLS RISK       Patient Stated       Get flu and shingles vaccines through [facility]   On track    Stay healthy        Depression Screen PHQ 2/9 Scores 11/19/2019 11/19/2019 05/14/2018 04/09/2018 09/17/2016 08/01/2016 02/28/2015  PHQ - 2 Score 0 0 '1 1 1 1 ' 0  PHQ- 9 Score - - - - 4 - -    Fall Risk Fall Risk  11/21/2020 11/19/2019 11/17/2018 05/14/2018 04/09/2018  Falls in the past year? 1 0 0 1 1  Number falls in past yr: 1 0 0 1 1  Injury with Fall? 0 0 0 1 1  Comment - - - - -  Risk for fall due to : - No Fall Risks - - History of fall(s);Impaired balance/gait;Other (Comment)  Risk for fall due to: Comment - - - - neuropathy   Follow up - Falls evaluation completed - - Falls prevention discussed;Education provided;Falls evaluation completed    FALL RISK PREVENTION PERTAINING TO THE HOME:  Any stairs in or around the home? yes If so, are there any without handrails? No  Home free of loose throw rugs in walkways, pet beds, electrical cords, etc?  Yes  Adequate lighting in your home to reduce risk of falls? Yes   ASSISTIVE DEVICES UTILIZED TO PREVENT FALLS:  Life alert? No  Use of a cane, walker or w/c? Yes  Grab bars in the bathroom? Yes  Shower chair or bench in shower? Yes  Elevated toilet seat or a handicapped toilet? No   Cognitive Function:     6CIT Screen 11/19/2019 04/09/2018 09/17/2016  What Year? 0 points 0 points 0 points  What month? 0 points 0 points 0 points  What time? 0 points 0 points 0 points  Count back from 20 0 points 0 points 0 points  Months in reverse 0 points 0 points 0 points  Repeat phrase 0 points 2 points 0 points  Total Score 0 2 0    Immunizations Immunization History  Administered Date(s) Administered   Influenza Split 10/20/2010   Influenza,inj,Quad PF,6+ Mos 01/12/2013, 11/10/2013   Pneumococcal Polysaccharide-23 10/20/2010   Td 01/02/2004    TDAP status: Up to date  Flu Vaccine status: Due, Education has been provided regarding the importance of this vaccine. Advised may receive this vaccine at local pharmacy or Health Dept. Aware to provide a copy of the vaccination record if obtained from local pharmacy or Health Dept. Verbalized acceptance and understanding.  Pneumococcal vaccine status: Due, Education has been provided regarding the importance of this vaccine. Advised may receive this vaccine at local pharmacy or Health Dept. Aware to provide a copy of the vaccination record if obtained from local pharmacy or Health Dept. Verbalized acceptance and understanding.  Covid-19 vaccine status: Completed vaccines  Qualifies for Shingles Vaccine? Yes   Zostavax completed No   Shingrix Completed?: No.    Education has been provided regarding the importance of this vaccine.  Patient has been advised to call insurance company to determine out of pocket expense if they have not yet received this vaccine. Advised may also receive vaccine at local pharmacy or Health Dept. Verbalized acceptance  and understanding.  Screening Tests Health Maintenance  Topic Date Due   Zoster Vaccines- Shingrix (1 of 2) Never done   Pneumonia Vaccine 62+ Years old (2 - PCV) 10/20/2011   TETANUS/TDAP  01/01/2014   COLONOSCOPY (Pts 45-71yr Insurance coverage will need to be confirmed)  01/20/2019   COVID-19 Vaccine (4 - Booster for Moderna series) 02/15/2020   INFLUENZA VACCINE  08/01/2020   Hepatitis C Screening  Completed   HPV VACCINES  Aged Out    Health Maintenance  Health Maintenance Due  Topic Date Due   Zoster Vaccines- Shingrix (1 of 2) Never done   Pneumonia Vaccine 75 Years old (2 - PCV) 10/20/2011   TETANUS/TDAP  01/01/2014   COLONOSCOPY (Pts 45-471yrInsurance coverage will need to be confirmed)  01/20/2019   COVID-19 Vaccine (4 - Booster for Moderna series) 02/15/2020   INFLUENZA VACCINE  08/01/2020    Colorectal cancer screening: Type of screening: Colonoscopy. Completed 2022. Repeat every 5 years  Lung Cancer Screening: (Low Dose CT Chest recommended if Age 75-80ears, 30 pack-year currently smoking OR have quit w/in 15years.) does qualify.   Lung Cancer Screening Referral: no  Additional Screening:  Hepatitis C Screening: does not qualify; Completed yes  Vision Screening: Recommended annual ophthalmology exams for early detection of glaucoma and other disorders of the eye. Is the patient up to date with their annual eye exam?  Yes  Who is the provider or what is the name of the office in which the patient attends [facility] If pt is not established with a provider, would they like to be referred to a provider to establish care? No .   Dental Screening: Recommended annual dental exams for proper oral hygiene  Community Resource Referral / Chronic Care Management: CRR required this visit?  No   CCM required this visit?  No      Plan:     I have personally reviewed and noted the following in the patient's chart:   Medical and social history Use  of alcohol, tobacco or illicit drugs  Current medications and supplements including opioid prescriptions. Patient is not currently taking opioid prescriptions. Functional ability and status Nutritional status Physical activity Advanced directives List of other physicians Hospitalizations, surgeries, and ER visits in previous 12 months Vitals Screenings to include cognitive, depression, and falls Referrals and appointments  In addition, I have reviewed and discussed with patient certain preventive protocols, quality metrics, and best practice recommendations. A written personalized care plan for preventive services as well as general preventive health recommendations were provided to patient.     BrKate SableLPN, LPN   [id number] Nurse Notes:  Mr. WaDenham Thank you for taking time to come for your Medicare Wellness Visit. I appreciate your ongoing commitment to your health goals. Please review the following plan we discussed and let me know if I can assist you in the future.   These are the goals we discussed:  Goals      LIFESTYLE - DECREASE FALLS RISK     Patient Stated     Get flu and shingles vaccines through [facility]     Stay healthy         This is a  list of the screening recommended for you and due dates:  Health Maintenance  Topic Date Due   Zoster (Shingles) Vaccine (1 of 2) Never done   Pneumonia Vaccine (2 - PCV) 10/20/2011   Tetanus Vaccine  01/01/2014   Colon Cancer Screening  01/20/2019   COVID-19 Vaccine (4 - Booster for Moderna series) 02/15/2020   Flu Shot  08/01/2020   Hepatitis C Screening: USPSTF Recommendation to screen - Ages 18-79 yo.  Completed   HPV Vaccine  Aged Out

## 2021-11-27 ENCOUNTER — Ambulatory Visit (INDEPENDENT_AMBULATORY_CARE_PROVIDER_SITE_OTHER): Payer: Medicare Other

## 2021-11-27 DIAGNOSIS — Z0000 Encounter for general adult medical examination without abnormal findings: Secondary | ICD-10-CM | POA: Diagnosis not present

## 2021-11-27 NOTE — Patient Instructions (Signed)
  [name] , Thank you for taking time to come for your Medicare Wellness Visit. I appreciate your ongoing commitment to your health goals. Please review the following plan we discussed and let me know if I can assist you in the future.   These are the goals we discussed:  Goals      LIFESTYLE - DECREASE FALLS RISK     Patient Stated     Get flu and shingles vaccines through [place]      Patient Stated     Patient Stated     Patient states that his goal is to live as long as he can.     Protect My Health     Stay healthy         This is a list of the screening recommended for you and due dates:  Health Maintenance  Topic Date Due   Zoster (Shingles) Vaccine (1 of 2) Never done   Pneumonia Vaccine (2 - PCV) 10/20/2011   Screening for Lung Cancer  08/14/2017   Flu Shot  08/01/2021   COVID-19 Vaccine (4 - 2023-24 season) 09/01/2021   Medicare Annual Wellness Visit  11/28/2022   Hepatitis C Screening: USPSTF Recommendation to screen - Ages 18-79 yo.  Completed   HPV Vaccine  Aged Out   Colon Cancer Screening  Discontinued

## 2021-11-27 NOTE — Progress Notes (Signed)
Subjective:   [name] is a 76 y.o. male who presents for Medicare Annual/Subsequent preventive examination. I connected with  Annamary Rummage on 11/27/21 by a audio enabled telemedicine application and verified that I am speaking with the correct person using two identifiers.  Patient Location: Home  Provider Location: Office/Clinic  I discussed the limitations of evaluation and management by telemedicine. The patient expressed understanding and agreed to proceed.  Review of Systems           Objective:    There were no vitals filed for this visit. There is no height or weight on file to calculate BMI.     11/21/2020   10:01 AM 11/19/2019    1:42 PM 04/09/2018    8:34 AM 09/17/2016    2:45 PM  Advanced Directives  Does Patient Have a Medical Advance Directive? No No No No  Does patient want to make changes to medical advance directive?   No - Patient declined   Would patient like information on creating a medical advance directive? Yes (ED - Information included in AVS) No - Patient declined  Yes (MAU/Ambulatory/Procedural Areas - Information given)    Current Medications (verified) Outpatient Encounter Medications as of 11/27/2021  Medication Sig   amLODipine (NORVASC) 10 MG tablet Take 10 mg by mouth daily.   aspirin EC 81 MG tablet Take 81 mg by mouth daily. Takes occasionally   Aspirin-Salicylamide-Caffeine (BC HEADACHE POWDER PO) Take by mouth as needed.   cyclophosphamide (CYTOXAN) 50 MG capsule Take 50 mg by mouth once a week. Give on an empty stomach 1 hour before or 2 hours after meals.  Takes 8 caps at one time, once a week for 3 weeks (Monday), skips the 4th week, then repeats.   dexamethasone (DECADRON) 4 MG tablet Take 5 tablets by mouth every Monday   ibuprofen (ADVIL,MOTRIN) 800 MG tablet One daily as need for pain Limit to 4 days per week   ixazomib citrate (NINLARO) 4 MG capsule Take 4 mg by mouth once a week. Take on an empty stomach 1hr before or  2hrs after food. Do not crush, chew or open.  Skips the 4th week, the repeats   oxyCODONE-acetaminophen (PERCOCET) 10-325 MG tablet Take 1 tablet by mouth every 4-6 hrs as needed for pain, do not exceed 6 tablets a day   No facility-administered encounter medications on file as of 11/27/2021.    Allergies (verified) Patient has no known allergies.   History: Past Medical History:  Diagnosis Date   Back pain    Benign prostatic hypertrophy    Cancer ([place]) 07/2016   Multiple Myeloma   Chronic depression    DJD (degenerative joint disease) of cervical spine    ED (erectile dysfunction)    Gout    Hematospermia    HTN (hypertension)    Hyperlipidemia    IGT (impaired glucose tolerance)    Lipoma    Multiple myeloma ([place]) 07/2016   Nicotine addiction    Obesity    PTSD (post-traumatic stress disorder)    Vitamin D deficiency    Past Surgical History:  Procedure Laterality Date   APPENDECTOMY     CHOLECYSTECTOMY     right bunionectomy     SPINE SURGERY  2002   lumbar   SPINE SURGERY  2020   llumbar   WISDOM TOOTH EXTRACTION     Family History  Problem Relation Age of Onset   Heart disease Mother    Hypertension Mother  Colon cancer Father    Diabetes Brother    Hypokalemia Daughter    Social History   Socioeconomic History   Marital status: Married    Spouse name: Not on file   Number of children: Not on file   Years of education: Not on file   Highest education level: Not on file  Occupational History   Not on file  Tobacco Use   Smoking status: Every Day    Packs/day: 0.25    Years: 58.00    Total pack years: 14.50    Types: Cigars, Cigarettes    Start date: 08/29/1957    Last attempt to quit: 08/17/2016    Years since quitting: 5.2   Smokeless tobacco: Never   Tobacco comments:    approx quit date   Vaping Use   Vaping Use: Never used  Substance and Sexual Activity   Alcohol use: No    Alcohol/week: 0.0 standard drinks of alcohol   Drug use:  No   Sexual activity: Not on file  Other Topics Concern   Not on file  Social History Narrative   Not on file   Social Determinants of Health   Financial Resource Strain: Low Risk  (11/21/2020)   Overall Financial Resource Strain (CARDIA)    Difficulty of Paying Living Expenses: Not very hard  Food Insecurity: No Food Insecurity (11/21/2020)   Hunger Vital Sign    Worried About Running Out of Food in the Last Year: Never true    Ran Out of Food in the Last Year: Never true  Transportation Needs: No Transportation Needs (11/21/2020)   PRAPARE - Hydrologist (Medical): No    Lack of Transportation (Non-Medical): No  Physical Activity: Insufficiently Active (11/21/2020)   Exercise Vital Sign    Days of Exercise per Week: 3 days    Minutes of Exercise per Session: 20 min  Stress: No Stress Concern Present (11/19/2019)   [place]    Feeling of Stress : Not at all  Social Connections: Moderately Integrated (11/21/2020)   Social Connection and Isolation Panel [NHANES]    Frequency of Communication with Friends and Family: More than three times a week    Frequency of Social Gatherings with Friends and Family: More than three times a week    Attends Religious Services: More than 4 times per year    Active Member of [employer] or Organizations: No    Attends Music therapist: Never    Marital Status: Married    Tobacco Counseling Ready to quit: Not Answered Counseling given: Not Answered Tobacco comments: approx quit date    Clinical Intake:                 Diabetic?no         Activities of Daily Living     No data to display          Patient Care Team: Fayrene Helper, MD as PCP - General  Indicate any recent Medical Services you may have received from other than [facility] providers in the past year (date may be approximate).     Assessment:   This  is a routine wellness examination for [name].  Hearing/Vision screen No results found.  Dietary issues and exercise activities discussed:     Goals Addressed   None    Depression Screen    11/19/2019    1:45 PM 11/19/2019    1:40  PM 05/14/2018    2:40 PM 04/09/2018    8:35 AM 09/17/2016    2:47 PM 08/01/2016    3:59 PM 02/28/2015    1:06 PM  PHQ 2/9 Scores  PHQ - 2 Score 0 0 _0 0  PHQ- 9 Score     4      Fall Risk    11/21/2020   10:10 AM 11/19/2019    1:43 PM 11/17/2018    2:48 PM 05/14/2018    2:39 PM 04/09/2018    8:35 AM  Fall Risk   Falls in the past year? 1 0 0 1 1  Number falls in past yr: 1 0 0 1 1  Injury with Fall? 0 0 0 1 1  Risk for fall due to :  No Fall Risks   History of fall(s);Impaired balance/gait;Other (Comment)  Risk for fall due to: Comment     neuropathy   Follow up  Falls evaluation completed   Falls prevention discussed;Education provided;Falls evaluation completed    FALL RISK PREVENTION PERTAINING TO THE HOME:  Any stairs in or around the home? Yes  If so, are there any without handrails? No  Home free of loose throw rugs in walkways, pet beds, electrical cords, etc? Yes  Adequate lighting in your home to reduce risk of falls? Yes   ASSISTIVE DEVICES UTILIZED TO PREVENT FALLS:  Life alert? No  Use of a cane, walker or w/c? Yes  Grab bars in the bathroom? Yes  Shower chair or bench in shower? Yes  Elevated toilet seat or a handicapped toilet? Yes   TIMED UP AND GO:  Was the test performed? No .  Length of time to ambulate 10 feet:  sec.     Cognitive Function:        11/19/2019    1:48 PM 04/09/2018    8:40 AM 09/17/2016    4:55 PM  6CIT Screen  What Year? 0 points 0 points 0 points  What month? 0 points 0 points 0 points  What time? 0 points 0 points 0 points  Count back from 20 0 points 0 points 0 points  Months in reverse 0 points 0 points 0 points  Repeat phrase 0 points 2 points 0 points  Total Score 0 points 2  points 0 points    Immunizations Immunization History  Administered Date(s) Administered   Influenza Split 10/20/2010   Influenza,inj,Quad PF,6+ Mos 01/12/2013, 11/10/2013   Pneumococcal Polysaccharide-23 10/20/2010   Td 01/02/2004    TDAP status: Due, Education has been provided regarding the importance of this vaccine. Advised may receive this vaccine at local pharmacy or Health Dept. Aware to provide a copy of the vaccination record if obtained from local pharmacy or Health Dept. Verbalized acceptance and understanding.  Flu Vaccine status: Due, Education has been provided regarding the importance of this vaccine. Advised may receive this vaccine at local pharmacy or Health Dept. Aware to provide a copy of the vaccination record if obtained from local pharmacy or Health Dept. Verbalized acceptance and understanding.  Pneumococcal vaccine status: Due, Education has been provided regarding the importance of this vaccine. Advised may receive this vaccine at local pharmacy or Health Dept. Aware to provide a copy of the vaccination record if obtained from local pharmacy or Health Dept. Verbalized acceptance and understanding.  Covid-19 vaccine status: Information provided on how to obtain vaccines.   Qualifies for Shingles Vaccine? Yes   Zostavax completed No   Shingrix Completed?:  No.    Education has been provided regarding the importance of this vaccine. Patient has been advised to call insurance company to determine out of pocket expense if they have not yet received this vaccine. Advised may also receive vaccine at local pharmacy or Health Dept. Verbalized acceptance and understanding.  Screening Tests Health Maintenance  Topic Date Due   Zoster Vaccines- Shingrix (1 of 2) Never done   Pneumonia Vaccine 80+ Years old (2 - PCV) 10/20/2011   Lung Cancer Screening  08/14/2017   INFLUENZA VACCINE  08/01/2021   COVID-19 Vaccine (4 - 2023-24 season) 09/01/2021   Medicare Annual Wellness  (AWV)  11/21/2021   Hepatitis C Screening  Completed   HPV VACCINES  Aged Out   COLONOSCOPY (Pts 45-42yr Insurance coverage will need to be confirmed)  Discontinued    Health Maintenance  Health Maintenance Due  Topic Date Due   Zoster Vaccines- Shingrix (1 of 2) Never done   Pneumonia Vaccine 76 Years old (2 - PCV) 10/20/2011   Lung Cancer Screening  08/14/2017   INFLUENZA VACCINE  08/01/2021   COVID-19 Vaccine (4 - 2023-24 season) 09/01/2021   Medicare Annual Wellness (AWV)  11/21/2021      Lung Cancer Screening: (Low Dose CT Chest recommended if Age 76-80years, 30 pack-year currently smoking OR have quit w/in 15years.) does qualify.   Lung Cancer Screening Referral:   Additional Screening:  Hepatitis C Screening: does not qualify; Completed 06/01/13  Vision Screening: Recommended annual ophthalmology exams for early detection of glaucoma and other disorders of the eye. Is the patient up to date with their annual eye exam?  Yes  Who is the provider or what is the name of the office in which the patient attends annual eye exams?  [facility] If pt is not established with a provider, would they like to be referred to a provider to establish care? No .   Dental Screening: Recommended annual dental exams for proper oral hygiene  Community Resource Referral / Chronic Care Management: CRR required this visit?  No   CCM required this visit?  No      Plan:     I have personally reviewed and noted the following in the patient's chart:   Medical and social history Use of alcohol, tobacco or illicit drugs  Current medications and supplements including opioid prescriptions. Patient is not currently taking opioid prescriptions. Functional ability and status Nutritional status Physical activity Advanced directives List of other physicians Hospitalizations, surgeries, and ER visits in previous 12 months Vitals Screenings to include cognitive, depression, and  falls Referrals and appointments  In addition, I have reviewed and discussed with patient certain preventive protocols, quality metrics, and best practice recommendations. A written personalized care plan for preventive services as well as general preventive health recommendations were provided to patient.     JJill Side CPunta Gord[place]  11/27/2021   Nurse Notes:

## 2021-12-04 ENCOUNTER — Other Ambulatory Visit: Payer: Self-pay

## 2021-12-04 ENCOUNTER — Emergency Department (HOSPITAL_COMMUNITY): Payer: No Typology Code available for payment source

## 2021-12-04 ENCOUNTER — Emergency Department (HOSPITAL_COMMUNITY)
Admission: EM | Admit: 2021-12-04 | Discharge: 2021-12-04 | Disposition: A | Discharge status: Home / Self Care | Payer: No Typology Code available for payment source | Attending: Emergency Medicine | Admitting: Emergency Medicine

## 2021-12-04 ENCOUNTER — Encounter (HOSPITAL_COMMUNITY): Payer: Self-pay | Admitting: *Deleted

## 2021-12-04 DIAGNOSIS — M545 Low back pain, unspecified: Secondary | ICD-10-CM | POA: Diagnosis present

## 2021-12-04 DIAGNOSIS — Z79899 Other long term (current) drug therapy: Secondary | ICD-10-CM | POA: Insufficient documentation

## 2021-12-04 DIAGNOSIS — I2510 Atherosclerotic heart disease of native coronary artery without angina pectoris: Secondary | ICD-10-CM | POA: Insufficient documentation

## 2021-12-04 DIAGNOSIS — I10 Essential (primary) hypertension: Secondary | ICD-10-CM | POA: Insufficient documentation

## 2021-12-04 DIAGNOSIS — Z8579 Personal history of other malignant neoplasms of lymphoid, hematopoietic and related tissues: Secondary | ICD-10-CM | POA: Insufficient documentation

## 2021-12-04 DIAGNOSIS — M5441 Lumbago with sciatica, right side: Secondary | ICD-10-CM

## 2021-12-04 DIAGNOSIS — M5442 Lumbago with sciatica, left side: Secondary | ICD-10-CM | POA: Diagnosis not present

## 2021-12-04 LAB — CBC WITH DIFFERENTIAL/PLATELET
Abs Immature Granulocytes: 0.01 10*3/uL (ref 0.00–0.07)
Basophils Absolute: 0 10*3/uL (ref 0.0–0.1)
Basophils Relative: 1 %
Eosinophils Absolute: 0.2 10*3/uL (ref 0.0–0.5)
Eosinophils Relative: 4 %
HCT: 39.7 % (ref 39.0–52.0)
Hemoglobin: 12.6 g/dL — ABNORMAL LOW (ref 13.0–17.0)
Immature Granulocytes: 0 %
Lymphocytes Relative: 16 %
Lymphs Abs: 0.7 10*3/uL (ref 0.7–4.0)
MCH: 27.1 pg (ref 26.0–34.0)
MCHC: 31.7 g/dL (ref 30.0–36.0)
MCV: 85.4 fL (ref 80.0–100.0)
Monocytes Absolute: 0.5 10*3/uL (ref 0.1–1.0)
Monocytes Relative: 13 %
Neutro Abs: 2.8 10*3/uL (ref 1.7–7.7)
Neutrophils Relative %: 66 %
Platelets: 118 10*3/uL — ABNORMAL LOW (ref 150–400)
RBC: 4.65 MIL/uL (ref 4.22–5.81)
RDW: 16.5 % — ABNORMAL HIGH (ref 11.5–15.5)
WBC: 4.2 10*3/uL (ref 4.0–10.5)
nRBC: 0 % (ref 0.0–0.2)

## 2021-12-04 LAB — I-STAT CHEM 8, ED
BUN: 17 mg/dL (ref 8–23)
Calcium, Ion: 1.19 mmol/L (ref 1.15–1.40)
Chloride: 111 mmol/L (ref 98–111)
Creatinine, Ser: 1.4 mg/dL — ABNORMAL HIGH (ref 0.61–1.24)
Glucose, Bld: 85 mg/dL (ref 70–99)
HCT: 41 % (ref 39.0–52.0)
Hemoglobin: 13.9 g/dL (ref 13.0–17.0)
Potassium: 4.1 mmol/L (ref 3.5–5.1)
Sodium: 141 mmol/L (ref 135–145)
TCO2: 22 mmol/L (ref 22–32)

## 2021-12-04 LAB — URINALYSIS, ROUTINE W REFLEX MICROSCOPIC
Bacteria, UA: NONE SEEN
Bilirubin Urine: NEGATIVE
Glucose, UA: NEGATIVE mg/dL
Hgb urine dipstick: NEGATIVE
Ketones, ur: NEGATIVE mg/dL
Leukocytes,Ua: NEGATIVE
Nitrite: NEGATIVE
Protein, ur: 30 mg/dL — AB
Specific Gravity, Urine: 1.016 (ref 1.005–1.030)
pH: 5 (ref 5.0–8.0)

## 2021-12-04 LAB — COMPREHENSIVE METABOLIC PANEL
ALT: 10 U/L (ref 0–44)
AST: 30 U/L (ref 15–41)
Albumin: 4.2 g/dL (ref 3.5–5.0)
Alkaline Phosphatase: 61 U/L (ref 38–126)
Anion gap: 9 (ref 5–15)
BUN: 17 mg/dL (ref 8–23)
CO2: 19 mmol/L — ABNORMAL LOW (ref 22–32)
Calcium: 9.6 mg/dL (ref 8.9–10.3)
Chloride: 111 mmol/L (ref 98–111)
Creatinine, Ser: 1.37 mg/dL — ABNORMAL HIGH (ref 0.61–1.24)
GFR, Estimated: 53 mL/min — ABNORMAL LOW (ref >60)
Glucose, Bld: 83 mg/dL (ref 70–99)
Potassium: 4 mmol/L (ref 3.5–5.1)
Sodium: 139 mmol/L (ref 135–145)
Total Bilirubin: 0.7 mg/dL (ref 0.3–1.2)
Total Protein: 7.3 g/dL (ref 6.5–8.1)

## 2021-12-04 LAB — LACTIC ACID, PLASMA
Lactic Acid, Venous: 1.1 mmol/L (ref 0.5–1.9)
Lactic Acid, Venous: 0.7 mmol/L (ref 0.5–1.9)

## 2021-12-04 LAB — PROTIME-INR
INR: 1 (ref 0.8–1.2)
Prothrombin Time: 13.3 seconds (ref 11.4–15.2)

## 2021-12-04 LAB — C-REACTIVE PROTEIN: CRP: <0.5 mg/dL (ref <1.0)

## 2021-12-04 LAB — SEDIMENTATION RATE: Sed Rate: 10 mm/hr (ref 0–16)

## 2021-12-04 MED ORDER — HYDROCODONE-ACETAMINOPHEN 5-325 MG PO TABS: 2.0000 | ORAL_TABLET | ORAL | 0 refills | Status: DC | PRN

## 2021-12-04 MED ORDER — HYDROMORPHONE HCL 1 MG/ML IJ SOLN
1.0000 mg | Freq: Once | INTRAMUSCULAR | Status: AC
  Administered 2021-12-04: 1 mg via INTRAVENOUS
  Filled 2021-12-04: qty 1

## 2021-12-04 MED ORDER — FENTANYL CITRATE PF 50 MCG/ML IJ SOSY: 50.0000 ug | PREFILLED_SYRINGE | Freq: Once | INTRAMUSCULAR | Status: DC

## 2021-12-04 MED ORDER — KETOROLAC TROMETHAMINE 15 MG/ML IJ SOLN
15.0000 mg | Freq: Once | INTRAMUSCULAR | Status: AC
  Administered 2021-12-04: 15 mg via INTRAVENOUS
  Filled 2021-12-04: qty 1

## 2021-12-04 MED ORDER — HYDROMORPHONE HCL 1 MG/ML IJ SOLN
0.5000 mg | Freq: Once | INTRAMUSCULAR | Status: AC
  Administered 2021-12-04: 0.5 mg via INTRAVENOUS
  Filled 2021-12-04: qty 0.5

## 2021-12-04 MED ORDER — LACTATED RINGERS IV BOLUS
500.0000 mL | Freq: Once | INTRAVENOUS | Status: AC
  Administered 2021-12-04: 500 mL via INTRAVENOUS

## 2021-12-04 MED ORDER — HYDROCODONE-ACETAMINOPHEN 5-325 MG PO TABS: 2.0000 | ORAL_TABLET | Freq: Four times a day (QID) | ORAL | 0 refills | Status: AC | PRN

## 2021-12-04 MED ORDER — OXYCODONE-ACETAMINOPHEN 5-325 MG PO TABS
1.0000 | ORAL_TABLET | Freq: Once | ORAL | Status: AC
  Administered 2021-12-04: 1 via ORAL
  Filled 2021-12-04: qty 1

## 2021-12-04 MED ORDER — GADOBUTROL 1 MMOL/ML IV SOLN
8.0000 mL | Freq: Once | INTRAVENOUS | Status: AC | PRN
  Administered 2021-12-04: 8 mL via INTRAVENOUS

## 2021-12-04 MED ORDER — METHYLPREDNISOLONE 4 MG PO TBPK: ORAL_TABLET | ORAL | 0 refills | Status: AC

## 2021-12-04 MED ORDER — HYDROCODONE-ACETAMINOPHEN 5-325 MG PO TABS: 2.0000 | ORAL_TABLET | ORAL | 0 refills | Status: AC | PRN

## 2021-12-04 MED ORDER — LIDOCAINE 5 % EX PTCH: 1.0000 | MEDICATED_PATCH | CUTANEOUS | 0 refills | Status: AC

## 2021-12-04 NOTE — ED Provider Notes (Signed)
[facility] EMERGENCY DEPARTMENT Provider Note   CSN: [id number] Arrival date & time: 12/04/21  1022     History  Chief Complaint  Patient presents with   Back Pain    [name] is a 76 y.o. male.   Back Pain Patient presents for back pain.  Medical history includes HLD, gout, HTN, BPH, OSA, anemia, multiple myeloma, CAD.  Onset of pain was 4 days ago.  At the time, he was pulling a heavy generator.  He felt a pop but did not experience immediate pain.  He did start to experience pain starting that night.  Pain has been located in left lower back.  It has been worsening.  He was seen at [facility] urgent care earlier today.  At that time, he described numbness and tingling in his lower extremities.  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  He does have a walker, cane, and motorized scooter that he has been using due to the severe neuropathy that he experiences.  Currently, patient endorses severe pain in area of left lower back.  It does radiate down the anterior and posterior aspects of his left leg.  Pain is worsened with any movements.  For pain at home, he has been taking ibuprofen and Tylenol only.  He has BPH and has not had any urinary changes.  Although he typically has irregular bowel movements, he has not a bowel movement the past 4 days.  He attributes this to the severe pain.     Home Medications Prior to Admission medications   Medication Sig Start Date End Date Taking? Authorizing Provider  amLODipine (NORVASC) 10 MG tablet Take 10 mg by mouth daily.    [provider]  aspirin EC 81 MG tablet Take 81 mg by mouth daily. Takes occasionally Patient not taking: Reported on 11/27/2021    [provider]  Aspirin-Salicylamide-Caffeine (BC HEADACHE POWDER PO) Take by mouth as needed.    [provider]  cyclophosphamide (CYTOXAN) 50 MG capsule Take 50 mg by mouth once a week. Give  on an empty stomach 1 hour before or 2 hours after meals.  Takes 8 caps at one time, once a week for 3 weeks (Monday), skips the 4th week, then repeats.    [provider]  dexamethasone (DECADRON) 4 MG tablet Take 5 tablets by mouth every Monday 11/17/18   Fayrene Helper, MD  ibuprofen (ADVIL,MOTRIN) 800 MG tablet One daily as need for pain Limit to 4 days per week Patient not taking: Reported on 11/27/2021 08/04/15   Fayrene Helper, MD  ixazomib citrate (NINLARO) 4 MG capsule Take 4 mg by mouth once a week. Take on an empty stomach 1hr before or 2hrs after food. Do not crush, chew or open.  Skips the 4th week, the repeats    [provider]  oxyCODONE-acetaminophen (PERCOCET) 10-325 MG tablet Take 1 tablet by mouth every 4-6 hrs as needed for pain, do not exceed 6 tablets a day Patient not taking: Reported on 11/27/2021 06/21/18   [provider]      Allergies    Patient has no known allergies.    Review of Systems   Review of Systems  Gastrointestinal:  Positive for constipation.  Musculoskeletal:  Positive for back pain.  All other systems reviewed and are negative.   Physical Exam Updated Vital Signs BP (!) 192/80 (BP Location: Right Arm)   Pulse (!) 58  Temp 98.5 F (36.9 C) (Oral)   Resp 12   Ht _0  (1.88 m)   Wt 90.3 kg   SpO2 100%   BMI 25.55 kg/m  Physical Exam Vitals and nursing note reviewed.  Constitutional:      Appearance: Normal appearance. He is well-developed. He is not ill-appearing, toxic-appearing or diaphoretic.  HENT:     Head: Normocephalic and atraumatic.     Right Ear: External ear normal.     Left Ear: External ear normal.     Nose: Nose normal.     Mouth/Throat:     Mouth: Mucous membranes are moist.     Pharynx: Oropharynx is clear.  Eyes:     Extraocular Movements: Extraocular movements intact.     Conjunctiva/sclera: Conjunctivae normal.  Cardiovascular:     Rate and Rhythm: Normal rate and  regular rhythm.  Pulmonary:     Effort: Pulmonary effort is normal. No respiratory distress.  Abdominal:     General: There is no distension.     Palpations: Abdomen is soft.     Tenderness: There is no abdominal tenderness.  Musculoskeletal:        General: Tenderness (Left posterior iliac crest) present. No swelling.     Cervical back: Normal range of motion and neck supple.     Right lower leg: No edema.     Left lower leg: No edema.     Comments: LLE ROM limited by pain  Skin:    General: Skin is warm and dry.     Coloration: Skin is not jaundiced or pale.  Neurological:     General: No focal deficit present.     Mental Status: He is alert and oriented to person, place, and time.  Psychiatric:        Mood and Affect: Mood normal.        Behavior: Behavior normal.        Thought Content: Thought content normal.        Judgment: Judgment normal.     ED Results / Procedures / Treatments   Labs (all labs ordered are listed, but only abnormal results are displayed) Labs Reviewed  URINALYSIS, ROUTINE W REFLEX MICROSCOPIC - Abnormal; Notable for the following components:      Result Value   Protein, ur 30 (*)    All other components within normal limits  CBC WITH DIFFERENTIAL/PLATELET - Abnormal; Notable for the following components:   Hemoglobin 12.6 (*)    RDW 16.5 (*)    Platelets 118 (*)    All other components within normal limits  I-STAT CHEM 8, ED - Abnormal; Notable for the following components:   Creatinine, Ser 1.40 (*)    All other components within normal limits  CULTURE, BLOOD (ROUTINE X 2)  CULTURE, BLOOD (ROUTINE X 2)  PROTIME-INR  LACTIC ACID, PLASMA  LACTIC ACID, PLASMA  CBC WITH DIFFERENTIAL/PLATELET  C-REACTIVE PROTEIN  SEDIMENTATION RATE  COMPREHENSIVE METABOLIC PANEL    EKG None  Radiology CT Renal Stone Study  Result Date: 12/04/2021 CLINICAL DATA:  Abdominal pain EXAM: CT ABDOMEN AND PELVIS WITHOUT CONTRAST TECHNIQUE: Multidetector CT  imaging of the abdomen and pelvis was performed following the standard protocol without IV contrast. RADIATION DOSE REDUCTION: This exam was performed according to the departmental dose-optimization program which includes automated exposure control, adjustment of the mA and/or kV according to patient size and/or use of iterative reconstruction technique. COMPARISON:  None Available. FINDINGS: Lower chest: Cardiomegaly.  No acute abnormality. Hepatobiliary:  No focal liver abnormality is seen. Status post cholecystectomy. No biliary dilatation. Pancreas: Unremarkable. No pancreatic ductal dilatation or surrounding inflammatory changes. Spleen: Normal in size without focal abnormality. Adrenals/Urinary Tract: Low-attenuation right adrenal gland nodule with -4 Hounsfield units measuring 1.6 cm, compatible with benign adrenal adenoma, no further follow-up imaging is recommended. Left adrenal gland is unremarkable. No hydronephrosis renal vascular calcifications with no definite nephrolithiasis. Indeterminate exophytic lesion of the mid region of the right kidney measuring 2.3 cm additional bilateral low-attenuation renal lesions which are compatible with simple cysts. Stomach/Bowel: Stomach is within normal limits. Appendix appears normal. Severe diverticulosis. No evidence of bowel wall thickening, distention, or inflammatory changes. Vascular/Lymphatic: Aortic atherosclerosis. No enlarged abdominal or pelvic lymph nodes. Reproductive: Prostate is unremarkable. Other: No abdominal wall hernia or abnormality. No abdominopelvic ascites. Musculoskeletal: Innumerable lucent osseous lesions.  Prior IMPRESSION: 1. No acute findings in the abdomen or pelvis. 2. Severe diverticulosis with no evidence of diverticulitis. 3. Exophytic lesion of the mid region of the right kidney measuring 2.3 cm, possibly a proteinaceous cyst technically indeterminate. Recommend nonemergent renal ultrasound for further evaluation. 4. Innumerable  lucent osseous lesions, compatible with patient history of multiple myeloma. 5. Aortic Atherosclerosis (ICD10-I70.0). Electronically Signed   By: Yetta Glassman M.D.   On: 12/04/2021 15:44   DG Lumbar Spine 2-3 Views  Result Date: 12/04/2021 CLINICAL DATA:  Left lower back pain for 4 days, radiates to the left hip and down the left leg. EXAM: LUMBAR SPINE - 2-3 VIEW COMPARISON:  06/17/2018. FINDINGS: Mild rotatory dextroconvex scoliosis. L2-L5 posterior interbody fusion with interbody struts. Osteopenia. Anterior marginal osteophytosis at T12-L1 and L1-2. Fused L5-S1. Fractured right L5 screw, unchanged. IMPRESSION: 1. L2-L5 posterior interbody fusion. Fractured right L5 screw, as before. 2. Osteopenia. 3. Anterior marginal osteophytosis at T12-L1 and L1-2. 4. Fused L5-S1. 5. Mild rotatory dextroconvex scoliosis. Electronically Signed   By: Lorin Picket M.D.   On: 12/04/2021 13:13   DG Hip Unilat W or Wo Pelvis 2-3 Views Left  Result Date: 12/04/2021 CLINICAL DATA:  Left back pain radiating to left hip and down left leg EXAM: DG HIP (WITH OR WITHOUT PELVIS) 3V LEFT COMPARISON:  None Available. FINDINGS: There is no evidence of hip fracture or dislocation. There is no evidence of arthropathy or other focal bone abnormality. Posterior fixation hardware in the lower lumbar spine, which is better evaluated on the same day lumbar spine radiographs. IMPRESSION: No acute fracture or dislocation of the left hip. No significant degenerative changes. No acute osseous abnormality. Electronically Signed   By: Merilyn Baba M.D.   On: 12/04/2021 13:12    Procedures Procedures    Medications Ordered in ED Medications  oxyCODONE-acetaminophen (PERCOCET/ROXICET) 5-325 MG per tablet 1 tablet (1 tablet Oral Given 12/04/21 1303)  lactated ringers bolus 500 mL (500 mLs Intravenous New Bag/Given 12/04/21 1516)  HYDROmorphone (DILAUDID) injection 0.5 mg (0.5 mg Intravenous Given 12/04/21 1517)  HYDROmorphone  (DILAUDID) injection 1 mg (1 mg Intravenous Given 12/04/21 1635)  ketorolac (TORADOL) 15 MG/ML injection 15 mg (15 mg Intravenous Given 12/04/21 1634)    ED Course/ Medical Decision Making/ A&P Clinical Course as of 12/04/21 1711  Mon Dec 04, 2021  1329 DG Lumbar Spine 2-3 Views [RD]  2035 Back pain, multiple myeloma, pending imaging [MK]    Clinical Course User Index [MK] Kommor, Debe Coder, MD [RD] Godfrey Pick, MD  Medical Decision Making Amount and/or Complexity of Data Reviewed Labs: ordered. Radiology: ordered. Decision-making details documented in ED Course.  Risk Prescription drug management.   This patient presents to the ED for concern of back pain, this involves an extensive number of treatment options, and is a complaint that carries with it a high risk of complications and morbidity.  The differential diagnosis includes herniated disc, occult fracture, neoplasm, metastatic disease, nephrolithiasis, cauda equina syndrome   Co morbidities that complicate the patient evaluation  HLD, gout, HTN, BPH, OSA, anemia, multiple myeloma, CAD   Additional history obtained:  Additional history obtained from patient's wife External records from outside source obtained and reviewed including EMR   Lab Tests:  I Ordered, and personally interpreted labs.  The pertinent results include: (Results pending at time of signout)   Imaging Studies ordered:  I ordered imaging studies including x-ray imaging of lumbar spine and pelvis; CT of abdomen pelvis, MRI of pelvis and lumbar spine I independently visualized and interpreted imaging which showed no acute findings on x-ray imaging.  Results of remaining imaging pending at time of signout. I agree with the radiologist interpretation   Cardiac Monitoring: / EKG:  The patient was maintained on a cardiac monitor.  I personally viewed and interpreted the cardiac monitored which showed an underlying rhythm of:  Sinus rhythm  Problem List / ED Course / Critical interventions / Medication management  Patient is a 76 year old male presenting for acute left-sided low back pain.  Onset of this was 4 days ago.  I did follow him moving a heavy piece of equipment.  He has been treating his pain with ibuprofen and Tylenol with no relief.  He has had severe 10/10 pain over the past several days.  On arrival in the ED, vital signs notable for hypertension.  On assessment, patient appears to be in severe pain.  Pain is worsened with any movements.  He does have tenderness to palpation in the region of left iliac crest.  Range of motion of left lower extremity is limited by pain.  It is unclear if he has associated weakness due to the severity of the pain.  X-ray imaging of lumbar spine and pelvis was ordered.  Results showed no acute findings.  Patient was given dose of Percocet for analgesia.  Following this, he stated that he experienced headache and chills.  He also experienced no relief of his pain.  This prompted further workup.  Dilaudid was ordered for additional analgesia.  Care of patient was signed out to oncoming ED provider. I ordered medication including Percocet, Dilaudid for analgesia Reevaluation of the patient after these medicines showed that the patient stayed the same I have reviewed the patients home medicines and have made adjustments as needed   Social Determinants of Health:  Has access to outpatient care through the [place]         Final Clinical Impression(s) / ED Diagnoses Final diagnoses:  Acute left-sided low back pain with left-sided sciatica    Rx / DC Orders ED Discharge Orders     None         Godfrey Pick, MD 12/04/21 1711

## 2021-12-04 NOTE — ED Provider Notes (Signed)
  Physical Exam  BP (!) 177/89 (BP Location: Left Arm)   Pulse 63   Temp 98.4 F (36.9 C) (Oral)   Resp 19   Ht _0  (1.88 m)   Wt 90.3 kg   SpO2 100%   BMI 25.55 kg/m   Physical Exam Constitutional:      General: He is not in acute distress.    Appearance: Normal appearance.  HENT:     Head: Normocephalic and atraumatic.     Nose: No congestion or rhinorrhea.  Eyes:     General:        Right eye: No discharge.        Left eye: No discharge.     Extraocular Movements: Extraocular movements intact.     Pupils: Pupils are equal, round, and reactive to light.  Cardiovascular:     Rate and Rhythm: Normal rate and regular rhythm.     Heart sounds: No murmur heard. Pulmonary:     Effort: No respiratory distress.     Breath sounds: No wheezing or rales.  Abdominal:     General: There is no distension.     Tenderness: There is no abdominal tenderness.  Musculoskeletal:        General: Tenderness present. Normal range of motion.     Cervical back: Normal range of motion.  Skin:    General: Skin is warm and dry.  Neurological:     General: No focal deficit present.     Mental Status: He is alert.     Procedures  Procedures  ED Course / MDM   Clinical Course as of 12/04/21 2050  Mon Dec 04, 2021  1329 DG Lumbar Spine 2-3 Views [RD]  5732 Back pain, multiple myeloma, pending imaging [MK]    Clinical Course User Index [MK] Mc Bloodworth, Debe Coder, MD [RD] Godfrey Pick, MD   Medical Decision Making Amount and/or Complexity of Data Reviewed Labs: ordered. Radiology: ordered. Decision-making details documented in ED Course.  Risk Prescription drug management.   Patient received an handoff.  History of multiple myeloma with severe back pain.  Pending CT stone study and MRIs.  CT stone study with an exophytic renal lesion that the patient is already following with renal ultrasound for in the outpatient setting as well as innumerable bone lesions.  However, contrasted MRI  studies do not show enhancing bone lesions and these likely represent healed lesions.  His MRI L-spine shows multilevel foraminal disease but no cord involvement.  Pain ultimately improved with ER measures and patient will follow-up outpatient with prescriptions for pain control.       Teressa Lower, MD 12/05/21 1218

## 2021-12-04 NOTE — ED Triage Notes (Signed)
Pt sent here from [place] for left lower back pain starting 4 days ago and getting worse. Pt states pain radiates to left hip and down left leg. No BM in the last 4 days as well. Denies any blood in urine or burning with urination.

## 2021-12-06 LAB — BLOOD CULTURE ID PANEL (REFLEXED) - BCID2

## 2021-12-06 MED FILL — Hydrocodone-Acetaminophen Tab 5-325 MG: ORAL | Qty: 6 | Status: AC

## 2021-12-07 ENCOUNTER — Telehealth: Payer: Self-pay

## 2021-12-07 NOTE — Patient Outreach (Signed)
  Care Coordination TOC Note Transition Care Management Follow-up Telephone Call Date of discharge and from where: 12/04/21[facility]  Dx: "acute left sided low back pain" Red on EMMI-ED Discharge Alert Reason: "Scheduled follow up appt? No" Red Alert Date: 12/06/21 How have you been since you were released from the hospital? Call completed with spouse. She voices that patient is currently resting and taking a nap. She shares that patient is doing much better. Pain has improved and patient has not complained of that "severe pain" anymore. He is taking pain meds as ordered with relief. She is trying to make sure patient does not "overdo it" with activities. He has had a BM and she is monitoring to make sure he goes regularly.  Any questions or concerns? No  Items Reviewed: Did the pt receive and understand the discharge instructions provided? Yes  Medications obtained and verified? Yes  Other? Yes -pain mgmt, bowel regimen Any new allergies since your discharge? No  Dietary orders reviewed? Yes Do you have support at home? Yes   Home Care and Equipment/Supplies: Were home health services ordered? not applicable If so, what is the name of the agency? N/A  Has the agency set up a time to come to the patient's home? not applicable Were any new equipment or medical supplies ordered?  No What is the name of the medical supply agency? N/A Were you able to get the supplies/equipment? not applicable Do you have any questions related to the use of the equipment or supplies? No  Functional Questionnaire: (I = Independent and D = Dependent) ADLs: I  Bathing/Dressing- I  Meal Prep- I  Eating- I  Maintaining continence- I  Transferring/Ambulation- I  Managing Meds- I  Follow up appointments reviewed:  PCP Hospital f/u appt confirmed? Yes  Scheduled to see PCP at [facility]  on 12/12/21 . [facility] f/u appt confirmed?  N/A Are transportation arrangements needed? No  If  their condition worsens, is the pt aware to call PCP or go to the Emergency Dept.? Yes Was the patient provided with contact information for the PCP's office or ED? Yes Was to pt encouraged to call back with questions or concerns? Yes  SDOH assessments and interventions completed:   Yes SDOH Interventions Today    Flowsheet Row Most Recent Value  SDOH Interventions   Food Insecurity Interventions Intervention Not Indicated  Transportation Interventions Intervention Not Indicated       Care Coordination Interventions:  Education provided    Encounter Outcome:  Pt. Visit Completed    Enzo Montgomery, RN,BSN,CCM [place] [facility] Telephonic Care Management Coordinator Direct Phone: [phone] Toll Free: [phone] Fax: [phone]

## 2021-12-09 LAB — CULTURE, BLOOD (ROUTINE X 2)
Culture: NO GROWTH
Special Requests: ADEQUATE

## 2021-12-18 LAB — BACTERIAL ORGANISM REFLEX

## 2021-12-18 LAB — ORGANISM ID, BACTERIA: Source of Sample: 8664

## 2021-12-22 LAB — CULTURE, BLOOD (ROUTINE X 2)

## 2021-12-23 ENCOUNTER — Telehealth (HOSPITAL_BASED_OUTPATIENT_CLINIC_OR_DEPARTMENT_OTHER): Payer: Self-pay | Admitting: *Deleted

## 2021-12-23 NOTE — Telephone Encounter (Signed)
Post ED Visit - Positive Culture Follow-up  Culture report reviewed by antimicrobial stewardship pharmacist: [place] Team '[]'$  Nathan Batchelder, Pharm.D. '[]'$  [address], Pharm.D., BCPS AQ-ID '[]'$  Heide Guile, Pharm.D., BCPS '[]'$  Parks Neptune, Pharm.D., BCPS '[]'$  [place], Pharm.D., BCPS, AAHIVP '[]'$  [place], Pharm.D., BCPS, AAHIVP '[]'$  Legrand Como, PharmD, BCPS '[]'$  Salome Arnt, PharmD, BCPS '[]'$  Johnnette Gourd, PharmD, BCPS '[]'$  Hughes Better, PharmD '[]'$  Leeroy Cha, PharmD, BCPS '[x]'$  Laqueta Linden, PharmD  [place] Team '[]'$  [address], PharmD '[]'$  Leodis Sias, PharmD '[]'$  Lindell Spar, PharmD '[]'$  Royetta Asal, Rph '[]'$  Graylin Shiver) Rema Fendt, PharmD '[]'$  Glennon Mac, PharmD '[]'$  Arlyn Dunning, PharmD '[]'$  Netta Cedars, PharmD '[]'$  Dia Sitter, PharmD '[]'$  Leone Haven, PharmD '[]'$  Gretta Arab, PharmD '[]'$  Theodis Shove, PharmD '[]'$  Peggyann Juba, PharmD   Positive blood  culture  Inadequate growth and no further patient follow-up is required at this time.  [name] 12/23/2021, 10:53 AM
